# Patient Record
Sex: Female | Born: 1945
Health system: Southern US, Community
[De-identification: ages and names within clinical notes are randomized; demographics above are authoritative.]

## PROBLEM LIST (undated history)

## (undated) DIAGNOSIS — M5430 Sciatica, unspecified side: Secondary | ICD-10-CM

## (undated) DIAGNOSIS — I10 Essential (primary) hypertension: Secondary | ICD-10-CM

## (undated) DIAGNOSIS — K589 Irritable bowel syndrome without diarrhea: Secondary | ICD-10-CM

## (undated) DIAGNOSIS — K219 Gastro-esophageal reflux disease without esophagitis: Secondary | ICD-10-CM

## (undated) DIAGNOSIS — E7800 Pure hypercholesterolemia, unspecified: Secondary | ICD-10-CM

## (undated) DIAGNOSIS — K5090 Crohn's disease, unspecified, without complications: Secondary | ICD-10-CM

## (undated) DIAGNOSIS — K56699 Other intestinal obstruction unspecified as to partial versus complete obstruction: Secondary | ICD-10-CM

## (undated) DIAGNOSIS — E079 Disorder of thyroid, unspecified: Secondary | ICD-10-CM

## (undated) DIAGNOSIS — E119 Type 2 diabetes mellitus without complications: Secondary | ICD-10-CM

## (undated) DIAGNOSIS — D649 Anemia, unspecified: Secondary | ICD-10-CM

## (undated) DIAGNOSIS — K922 Gastrointestinal hemorrhage, unspecified: Secondary | ICD-10-CM

## (undated) HISTORY — PX: CHOLECYSTECTOMY: SHX55

## (undated) HISTORY — DX: Sciatica, unspecified side: M54.30

## (undated) HISTORY — PX: ESOPHAGOGASTRODUODENOSCOPY: SHX1529

## (undated) HISTORY — DX: Irritable bowel syndrome, unspecified: K58.9

## (undated) HISTORY — DX: Crohn's disease, unspecified, without complications: K50.90

## (undated) HISTORY — DX: Other intestinal obstruction unspecified as to partial versus complete obstruction: K56.699

## (undated) HISTORY — PX: COLONOSCOPY: SHX174

---

## 2013-12-18 ENCOUNTER — Encounter (HOSPITAL_BASED_OUTPATIENT_CLINIC_OR_DEPARTMENT_OTHER): Payer: Self-pay | Admitting: Emergency Medicine

## 2013-12-18 ENCOUNTER — Emergency Department (HOSPITAL_BASED_OUTPATIENT_CLINIC_OR_DEPARTMENT_OTHER): Payer: Medicare Other

## 2013-12-18 ENCOUNTER — Emergency Department (HOSPITAL_BASED_OUTPATIENT_CLINIC_OR_DEPARTMENT_OTHER)
Admission: EM | Admit: 2013-12-18 | Discharge: 2013-12-18 | Disposition: A | Discharge status: Home / Self Care | Payer: Medicare Other | Source: Home / Self Care | Attending: Emergency Medicine | Admitting: Emergency Medicine

## 2013-12-18 ENCOUNTER — Emergency Department (HOSPITAL_BASED_OUTPATIENT_CLINIC_OR_DEPARTMENT_OTHER)
Admission: EM | Admit: 2013-12-18 | Discharge: 2013-12-18 | Disposition: A | Discharge status: Home / Self Care | Payer: Medicare Other | Attending: Emergency Medicine | Admitting: Emergency Medicine

## 2013-12-18 ENCOUNTER — Telehealth (HOSPITAL_BASED_OUTPATIENT_CLINIC_OR_DEPARTMENT_OTHER): Payer: Self-pay | Admitting: *Deleted

## 2013-12-18 DIAGNOSIS — Z791 Long term (current) use of non-steroidal anti-inflammatories (NSAID): Secondary | ICD-10-CM | POA: Insufficient documentation

## 2013-12-18 DIAGNOSIS — E7800 Pure hypercholesterolemia, unspecified: Secondary | ICD-10-CM

## 2013-12-18 DIAGNOSIS — N390 Urinary tract infection, site not specified: Secondary | ICD-10-CM | POA: Insufficient documentation

## 2013-12-18 DIAGNOSIS — R110 Nausea: Secondary | ICD-10-CM | POA: Diagnosis not present

## 2013-12-18 DIAGNOSIS — H53149 Visual discomfort, unspecified: Secondary | ICD-10-CM | POA: Insufficient documentation

## 2013-12-18 DIAGNOSIS — R519 Headache, unspecified: Secondary | ICD-10-CM

## 2013-12-18 DIAGNOSIS — Z79899 Other long term (current) drug therapy: Secondary | ICD-10-CM | POA: Insufficient documentation

## 2013-12-18 DIAGNOSIS — E119 Type 2 diabetes mellitus without complications: Secondary | ICD-10-CM

## 2013-12-18 DIAGNOSIS — R51 Headache: Secondary | ICD-10-CM | POA: Insufficient documentation

## 2013-12-18 DIAGNOSIS — K219 Gastro-esophageal reflux disease without esophagitis: Secondary | ICD-10-CM

## 2013-12-18 DIAGNOSIS — I10 Essential (primary) hypertension: Secondary | ICD-10-CM

## 2013-12-18 DIAGNOSIS — Z792 Long term (current) use of antibiotics: Secondary | ICD-10-CM

## 2013-12-18 HISTORY — DX: Pure hypercholesterolemia, unspecified: E78.00

## 2013-12-18 HISTORY — DX: Type 2 diabetes mellitus without complications: E11.9

## 2013-12-18 HISTORY — DX: Gastro-esophageal reflux disease without esophagitis: K21.9

## 2013-12-18 HISTORY — DX: Essential (primary) hypertension: I10

## 2013-12-18 LAB — URINE MICROSCOPIC-ADD ON

## 2013-12-18 LAB — URINALYSIS, ROUTINE W REFLEX MICROSCOPIC
BILIRUBIN URINE: NEGATIVE
Glucose, UA: NEGATIVE mg/dL
Hgb urine dipstick: NEGATIVE
KETONES UR: NEGATIVE mg/dL
Nitrite: NEGATIVE
Protein, ur: 30 mg/dL — AB
SPECIFIC GRAVITY, URINE: 1.019 (ref 1.005–1.030)
UROBILINOGEN UA: 0.2 mg/dL (ref 0.0–1.0)
pH: 8.5 — ABNORMAL HIGH (ref 5.0–8.0)

## 2013-12-18 MED ORDER — KETOROLAC TROMETHAMINE 30 MG/ML IJ SOLN
30.0000 mg | Freq: Once | INTRAMUSCULAR | Status: AC
  Administered 2013-12-18: 30 mg via INTRAVENOUS
  Filled 2013-12-18: qty 1

## 2013-12-18 MED ORDER — HALOPERIDOL LACTATE 5 MG/ML IJ SOLN: 2.5000 mg | Freq: Once | INTRAMUSCULAR | Status: DC

## 2013-12-18 MED ORDER — DIPHENHYDRAMINE HCL 50 MG/ML IJ SOLN
25.0000 mg | Freq: Once | INTRAMUSCULAR | Status: AC
  Administered 2013-12-18: 25 mg via INTRAVENOUS
  Filled 2013-12-18: qty 1

## 2013-12-18 MED ORDER — METOCLOPRAMIDE HCL 5 MG/ML IJ SOLN
10.0000 mg | Freq: Once | INTRAMUSCULAR | Status: AC
  Administered 2013-12-18: 10 mg via INTRAVENOUS
  Filled 2013-12-18: qty 2

## 2013-12-18 MED ORDER — SODIUM CHLORIDE 0.9 % IV BOLUS (SEPSIS)
1000.0000 mL | Freq: Once | INTRAVENOUS | Status: AC
  Administered 2013-12-18: 1000 mL via INTRAVENOUS

## 2013-12-18 MED ORDER — CEPHALEXIN 500 MG PO CAPS: 500.0000 mg | ORAL_CAPSULE | Freq: Two times a day (BID) | ORAL | Status: DC

## 2013-12-18 MED ORDER — BUTALBITAL-APAP-CAFFEINE 50-325-40 MG PO TABS
1.0000 | ORAL_TABLET | Freq: Four times a day (QID) | ORAL | Status: DC | PRN
Start: 2013-12-18 — End: 2019-06-19

## 2013-12-18 MED ORDER — SODIUM CHLORIDE 0.9 % IV BOLUS (SEPSIS): 1000.0000 mL | Freq: Once | INTRAVENOUS | Status: DC

## 2013-12-18 NOTE — ED Notes (Signed)
Pt reports headache x 7 days. Reports some nausea no vomiting. Reports photosensitivty.

## 2013-12-18 NOTE — Discharge Instructions (Signed)
Do not hesitate to return to the Emergency Department for any new, worsening or concerning symptoms.   If you do not have a primary care doctor you can establish one at the   [facility]: [place] [id number] [phone]  After you establish care. Let them know you were seen in the emergency room. They must obtain records for further management.    General Headache Without Cause A general headache is pain or discomfort felt around the head or neck area. The cause may not be found.  HOME CARE   Keep all doctor visits.  Only take medicines as told by your doctor.  Lie down in a dark, quiet room when you have a headache.  Keep a journal to find out if certain things bring on headaches. For example, write down:  What you eat and drink.  How much sleep you get.  Any change to your diet or medicines.  Relax by getting a massage or doing other relaxing activities.  Put ice or heat packs on the head and neck area as told by your doctor.  Lessen stress.  Sit up straight. Do not tighten (tense) your muscles.  Quit smoking if you smoke.  Lessen how much alcohol you drink.  Lessen how much caffeine you drink, or stop drinking caffeine.  Eat and sleep on a regular schedule.  Get 7 to 9 hours of sleep, or as told by your doctor.  Keep lights dim if bright lights bother you or make your headaches worse. GET HELP RIGHT AWAY IF:   Your headache becomes really bad.  You have a fever.  You have a stiff neck.  You have trouble seeing.  Your muscles are weak, or you lose muscle control.  You lose your balance or have trouble walking.  You feel like you will pass out (faint), or you pass out.  You have really bad symptoms that are different than your first symptoms.  You have problems with the medicines given to you by your doctor.  Your medicines do not work.  Your headache feels different than the other headaches.  You feel sick to  your stomach (nauseous) or throw up (vomit). MAKE SURE YOU:   Understand these instructions.  Will watch your condition.  Will get help right away if you are not doing well or get worse. Document Released: 02/21/2008 Document Revised: 08/06/2011 Document Reviewed: 05/04/2011 [facility] Patient Information 2015 [place], [place]. This information is not intended to replace advice given to you by your health care provider. Make sure you discuss any questions you have with your health care provider.

## 2013-12-18 NOTE — ED Notes (Signed)
MD at bedside. 

## 2013-12-18 NOTE — ED Provider Notes (Signed)
Medical screening examination/treatment/procedure(s) were performed by non-physician practitioner and as supervising physician I was immediately available for consultation/collaboration.   EKG Interpretation None        Malvin Johns, MD 12/18/13 2321

## 2013-12-18 NOTE — ED Provider Notes (Signed)
CSN: [id number]     Arrival date & time 12/18/13  1943 History   First MD Initiated Contact with Patient 12/18/13 2008     Chief Complaint  Patient presents with  . Headache     (Consider location/radiation/quality/duration/timing/severity/associated sxs/prior Treatment) HPI  Carrie Watkins is a 68 y.o. female complaining of bilateral frontal headache rated 8/10 intermittently over the course of several weeks. Patient states that she has had headaches when she was younger but hasn't had them recently. She is photophobic and states that she felt dizzy earlier in the day (denies vertigo). Patient states that she had a fast heart rate she measured it at 97 she took Ativan at home states that this has resolved the fast heart rate she also endorses a shortness of breath and nasal congestion. Patient reports subjective fever, denies cervicalgia. Patient was seen earlier in the day had a normal head CT and felt improved after headache cocktail. States that the dizziness has resolved but that she is still in pain. Pt denies rash, confusion, cervicalgia, LOC/syncope, change in vision, N/V, numbness, weakness, dysarthria, ataxia, thunderclap onset, exacerbation with exertion or valsalva, exacerbation in morning, CP, abdominal pain.   Past Medical History  Diagnosis Date  . Hypertension   . Diabetes mellitus without complication   . GERD (gastroesophageal reflux disease)   . High cholesterol    History reviewed. No pertinent past surgical history. No family history on file. History  Substance Use Topics  . Smoking status: Never Smoker   . Smokeless tobacco: Not on file  . Alcohol Use: No   OB History   Grav Para Term Preterm Abortions TAB SAB Ect Mult Living                 Review of Systems  10 systems reviewed and found to be negative, except as noted in the HPI.   Allergies  Review of patient's allergies indicates no known allergies.  Home Medications   Prior to Admission  medications   Medication Sig Start Date End Date Taking? Authorizing Provider  butalbital-acetaminophen-caffeine (FIORICET) 50-325-40 MG per tablet Take 1 tablet by mouth every 6 (six) hours as needed for headache. 12/18/13   Elmyra Ricks Dianara Smullen, PA-C  celecoxib (CELEBREX) 200 MG capsule Take 200 mg by mouth 2 (two) times daily.    Historical Provider, MD  cephALEXin (KEFLEX) 500 MG capsule Take 1 capsule (500 mg total) by mouth 2 (two) times daily. 12/18/13   Houston Siren III, MD  LORazepam (ATIVAN) 0.5 MG tablet Take 0.5 mg by mouth every 8 (eight) hours.    Historical Provider, MD  metFORMIN (GLUCOPHAGE) 1000 MG tablet Take 1,000 mg by mouth 2 (two) times daily with a meal.    Historical Provider, MD  omeprazole (PRILOSEC) 40 MG capsule Take 40 mg by mouth daily.    Historical Provider, MD  simvastatin (ZOCOR) 10 MG tablet Take 10 mg by mouth daily.    Historical Provider, MD  valsartan-hydrochlorothiazide (DIOVAN-HCT) 160-12.5 MG per tablet Take 1 tablet by mouth daily.    Historical Provider, MD   BP 128/58  Pulse 88  Temp(Src) 98.5 F (36.9 C) (Oral)  Resp 22  Ht 5' 4"  (1.626 m)  Wt 170 lb (77.111 kg)  BMI 29.17 kg/m2  SpO2 100% Physical Exam  Nursing note and vitals reviewed. Constitutional: She is oriented to person, place, and time. She appears well-developed and well-nourished. No distress.  HENT:  Head: Normocephalic and atraumatic.  Mouth/Throat: Oropharynx is clear and  moist.  Eyes: Conjunctivae and EOM are normal. Pupils are equal, round, and reactive to light.  Neck: Normal range of motion. Neck supple.  FROM to C-spine. Pt can touch chin to chest without discomfort. No TTP of midline cervical spine.   Cardiovascular: Normal rate, regular rhythm and intact distal pulses.   Pulmonary/Chest: Effort normal and breath sounds normal. No stridor. No respiratory distress. She has no wheezes. She has no rales. She exhibits no tenderness.  Abdominal: Soft. Bowel sounds are  normal. She exhibits no distension and no mass. There is no tenderness. There is no rebound and no guarding.  Musculoskeletal: Normal range of motion. She exhibits no edema and no tenderness.  Neurological: She is alert and oriented to person, place, and time.  II-Visual fields grossly intact. III/IV/VI-Extraocular movements intact.  Pupils reactive bilaterally. V/VII-Smile symmetric, equal eyebrow raise,  facial sensation intact VIII- Hearing grossly intact IX/X-Normal gag XI-bilateral shoulder shrug XII-midline tongue extension Motor: 5/5 bilaterally with normal tone and bulk Cerebellar: Normal finger-to-nose  and normal heel-to-shin test.   Romberg negative Ambulates with a coordinated gait   Psychiatric: She has a normal mood and affect.    ED Course  Procedures (including critical care time) Labs Review Labs Reviewed  CBC WITH DIFFERENTIAL  BASIC METABOLIC PANEL    Imaging Review Ct Head Wo Contrast  12/18/2013   CLINICAL DATA:  68 year old female with headache for 3 days. Occipital region headache. Initial encounter.  EXAM: CT HEAD WITHOUT CONTRAST  TECHNIQUE: Contiguous axial images were obtained from the base of the skull through the vertex without intravenous contrast.  COMPARISON:  None.  FINDINGS: Visualized orbits and scalp soft tissues are within normal limits. No osseous abnormality identified. Visualized paranasal sinuses and mastoids are clear.  Cerebral volume is within normal limits for age. Mild Calcified atherosclerosis at the skull base. No suspicious intracranial vascular hyperdensity. No midline shift, ventriculomegaly, mass effect, evidence of mass lesion, intracranial hemorrhage or evidence of cortically based acute infarction. Gray-white matter differentiation is within normal limits throughout the brain; minimal nonspecific subcortical white matter hypodensity probably within normal limits for age.  IMPRESSION: Normal for age noncontrast CT appearance of the  brain.   Electronically Signed   By: Lars Pinks M.D.   On: 12/18/2013 09:43     EKG Interpretation None      MDM   Final diagnoses:  Nonintractable headache, unspecified chronicity pattern, unspecified headache type    Filed Vitals:   12/18/13 1948  BP: 128/58  Pulse: 88  Temp: 98.5 F (36.9 C)  TempSrc: Oral  Resp: 22  Height: 5' 4"  (1.626 m)  Weight: 170 lb (77.111 kg)  SpO2: 100%    Medications  sodium chloride 0.9 % bolus 1,000 mL (not administered)  haloperidol lactate (HALDOL) injection 2.5 mg (not administered)    Carrie Watkins is a 68 y.o. female presenting with persistent frontal headache. Patient was seen for similar several hours ago. States that the dizziness has resolved but the headache persists. Neuro exam is nonfocal. Patient had negative CT scan several hours ago. Patient also reports palpitations. I would like to get an EKG, basic blood work and Haldol for headache relief.  Patient reports that her headache has resolved and would like to leave the emergency room. I have recommended proceeding forward with the EKG and blood work however patient declined. I've advised her that she could change her mind return to the emergency room at any time. I've advised her to follow closely  with her primary care physician. Patient verbalized her understanding of the risks of leaving the emergency room without for further evaluation including death or disability.  Evaluation does not show pathology that would require ongoing emergent intervention or inpatient treatment. Pt is hemodynamically stable and mentating appropriately. Discussed findings and plan with patient/guardian, who agrees with care plan. All questions answered. Return precautions discussed and outpatient follow up given.   Discharge Medication List as of 12/18/2013  9:01 PM    START taking these medications   Details  butalbital-acetaminophen-caffeine (FIORICET) 50-325-40 MG per tablet Take 1 tablet by  mouth every 6 (six) hours as needed for headache., Starting 12/18/2013, Until Discontinued, [employer], PA-C 12/18/13 2211

## 2013-12-18 NOTE — ED Provider Notes (Signed)
CSN: [id number]     Arrival date & time 12/18/13  3664 History   First MD Initiated Contact with Patient 12/18/13 [phone]     Chief Complaint  Patient presents with  . Headache     (Consider location/radiation/quality/duration/timing/severity/associated sxs/prior Treatment) Patient is a 68 y.o. female presenting with headaches.  Headache Pain location:  Generalized Quality:  Dull Radiates to:  Does not radiate Severity currently:  8/10 Severity at highest:  10/10 Onset quality:  Gradual Duration: several weeks. Timing:  Constant Progression:  Waxing and waning Chronicity:  New Relieved by:  NSAIDs and cold packs Worsened by:  Light and sound Associated symptoms: nausea and photophobia   Associated symptoms: no abdominal pain, no blurred vision, no fever, no focal weakness, no visual change and no vomiting     Past Medical History  Diagnosis Date  . Hypertension   . Diabetes mellitus without complication   . GERD (gastroesophageal reflux disease)   . High cholesterol    History reviewed. No pertinent past surgical history. No family history on file. History  Substance Use Topics  . Smoking status: Never Smoker   . Smokeless tobacco: Not on file  . Alcohol Use: No   OB History   Grav Para Term Preterm Abortions TAB SAB Ect Mult Living                 Review of Systems  Constitutional: Negative for fever.  Eyes: Positive for photophobia. Negative for blurred vision.  Gastrointestinal: Positive for nausea. Negative for vomiting and abdominal pain.  Neurological: Positive for headaches. Negative for focal weakness.  All other systems reviewed and are negative.     Allergies  Review of patient's allergies indicates no known allergies.  Home Medications   Prior to Admission medications   Medication Sig Start Date End Date Taking? Authorizing Provider  celecoxib (CELEBREX) 200 MG capsule Take 200 mg by mouth 2 (two) times daily.   Yes Historical Provider, MD   LORazepam (ATIVAN) 0.5 MG tablet Take 0.5 mg by mouth every 8 (eight) hours.   Yes Historical Provider, MD  metFORMIN (GLUCOPHAGE) 1000 MG tablet Take 1,000 mg by mouth 2 (two) times daily with a meal.   Yes Historical Provider, MD  omeprazole (PRILOSEC) 40 MG capsule Take 40 mg by mouth daily.   Yes Historical Provider, MD  simvastatin (ZOCOR) 10 MG tablet Take 10 mg by mouth daily.   Yes Historical Provider, MD  valsartan-hydrochlorothiazide (DIOVAN-HCT) 160-12.5 MG per tablet Take 1 tablet by mouth daily.   Yes Historical Provider, MD   BP 163/73  Pulse 95  Temp(Src) 98.2 F (36.8 C) (Oral)  Resp 18  Ht 5' 4"  (1.626 m)  Wt 170 lb (77.111 kg)  BMI 29.17 kg/m2  SpO2 99% Physical Exam  Nursing note and vitals reviewed. Constitutional: She is oriented to person, place, and time. She appears well-developed and well-nourished. No distress.  HENT:  Head: Normocephalic and atraumatic.  Mouth/Throat: Oropharynx is clear and moist.  Eyes: Conjunctivae are normal. Pupils are equal, round, and reactive to light. No scleral icterus.  Neck: Neck supple.  Cardiovascular: Normal rate, regular rhythm, normal heart sounds and intact distal pulses.   No murmur heard. Pulmonary/Chest: Effort normal and breath sounds normal. No stridor. No respiratory distress. She has no rales.  Abdominal: Soft. Bowel sounds are normal. She exhibits no distension. There is no tenderness.  Musculoskeletal: Normal range of motion.  Neurological: She is alert and oriented to person, place, and time.  She has normal strength. No cranial nerve deficit or sensory deficit. Coordination and gait normal. GCS eye subscore is 4. GCS verbal subscore is 5. GCS motor subscore is 6.  Skin: Skin is warm and dry. No rash noted.  Psychiatric: She has a normal mood and affect. Her behavior is normal.    ED Course  Procedures (including critical care time) Labs Review Labs Reviewed  URINALYSIS, ROUTINE W REFLEX MICROSCOPIC -  Abnormal; Notable for the following:    pH 8.5 (*)    Protein, ur 30 (*)    Leukocytes, UA MODERATE (*)    All other components within normal limits  URINE MICROSCOPIC-ADD ON - Abnormal; Notable for the following:    Bacteria, UA MANY (*)    All other components within normal limits  URINE CULTURE    Imaging Review Ct Head Wo Contrast  12/18/2013   CLINICAL DATA:  68 year old female with headache for 3 days. Occipital region headache. Initial encounter.  EXAM: CT HEAD WITHOUT CONTRAST  TECHNIQUE: Contiguous axial images were obtained from the base of the skull through the vertex without intravenous contrast.  COMPARISON:  None.  FINDINGS: Visualized orbits and scalp soft tissues are within normal limits. No osseous abnormality identified. Visualized paranasal sinuses and mastoids are clear.  Cerebral volume is within normal limits for age. Mild Calcified atherosclerosis at the skull base. No suspicious intracranial vascular hyperdensity. No midline shift, ventriculomegaly, mass effect, evidence of mass lesion, intracranial hemorrhage or evidence of cortically based acute infarction. Gray-white matter differentiation is within normal limits throughout the brain; minimal nonspecific subcortical white matter hypodensity probably within normal limits for age.  IMPRESSION: Normal for age noncontrast CT appearance of the brain.   Electronically Signed   By: Lars Pinks M.D.   On: 12/18/2013 09:43  All radiology studies independently viewed by me.      EKG Interpretation None      MDM   Final diagnoses:  Acute intractable headache, unspecified headache type  UTI (lower urinary tract infection)    68 yo female with headache for several weeks now worse over past few days.  History of headaches many years ago, but none since.  Neuro intact.  Well appearing, nontoxic.  Not consistent with meningitis.  Plan CT head due to duration and new headache.  IV metoclopramide, diphenhydramine, and fluids for  symptoms.   Pt feeling much better.  UA has many bacteria and +leuks.  Will send for culture and treat with keflex.    Houston Siren III, MD 12/18/13 1136

## 2013-12-18 NOTE — Discharge Instructions (Signed)
General Headache Without Cause A headache is pain or discomfort felt around the head or neck area. The specific cause of a headache may not be found. There are many causes and types of headaches. A few common ones are:  Tension headaches.  Migraine headaches.  Cluster headaches.  Chronic daily headaches. HOME CARE INSTRUCTIONS   Keep all follow-up appointments with your caregiver or any specialist referral.  Only take over-the-counter or prescription medicines for pain or discomfort as directed by your caregiver.  Lie down in a dark, quiet room when you have a headache.  Keep a headache journal to find out what may trigger your migraine headaches. For example, write down:  What you eat and drink.  How much sleep you get.  Any change to your diet or medicines.  Try massage or other relaxation techniques.  Put ice packs or heat on the head and neck. Use these 3 to 4 times per day for 15 to 20 minutes each time, or as needed.  Limit stress.  Sit up straight, and do not tense your muscles.  Quit smoking if you smoke.  Limit alcohol use.  Decrease the amount of caffeine you drink, or stop drinking caffeine.  Eat and sleep on a regular schedule.  Get 7 to 9 hours of sleep, or as recommended by your caregiver.  Keep lights dim if bright lights bother you and make your headaches worse. SEEK MEDICAL CARE IF:   You have problems with the medicines you were prescribed.  Your medicines are not working.  You have a change from the usual headache.  You have nausea or vomiting. SEEK IMMEDIATE MEDICAL CARE IF:   Your headache becomes severe.  You have a fever.  You have a stiff neck.  You have loss of vision.  You have muscular weakness or loss of muscle control.  You start losing your balance or have trouble walking.  You feel faint or pass out.  You have severe symptoms that are different from your first symptoms. MAKE SURE YOU:   Understand these  instructions.  Will watch your condition.  Will get help right away if you are not doing well or get worse. Document Released: 05/14/2005 Document Revised: 08/06/2011 Document Reviewed: 05/30/2011 [facility] Patient Information 2015 [place], [place]. This information is not intended to replace advice given to you by your health care provider. Make sure you discuss any questions you have with your health care provider.  Urinary Tract Infection Urinary tract infections (UTIs) can develop anywhere along your urinary tract. Your urinary tract is your body's drainage system for removing wastes and extra water. Your urinary tract includes two kidneys, two ureters, a bladder, and a urethra. Your kidneys are a pair of bean-shaped organs. Each kidney is about the size of your fist. They are located below your ribs, one on each side of your spine. CAUSES Infections are caused by microbes, which are microscopic organisms, including fungi, viruses, and bacteria. These organisms are so small that they can only be seen through a microscope. Bacteria are the microbes that most commonly cause UTIs. SYMPTOMS  Symptoms of UTIs may vary by age and gender of the patient and by the location of the infection. Symptoms in young women typically include a frequent and intense urge to urinate and a painful, burning feeling in the bladder or urethra during urination. Older women and men are more likely to be tired, shaky, and weak and have muscle aches and abdominal pain. A fever may mean the  infection is in your kidneys. Other symptoms of a kidney infection include pain in your back or sides below the ribs, nausea, and vomiting. DIAGNOSIS To diagnose a UTI, your caregiver will ask you about your symptoms. Your caregiver also will ask to provide a urine sample. The urine sample will be tested for bacteria and white blood cells. White blood cells are made by your body to help fight infection. TREATMENT  Typically, UTIs can be  treated with medication. Because most UTIs are caused by a bacterial infection, they usually can be treated with the use of antibiotics. The choice of antibiotic and length of treatment depend on your symptoms and the type of bacteria causing your infection. HOME CARE INSTRUCTIONS  If you were prescribed antibiotics, take them exactly as your caregiver instructs you. Finish the medication even if you feel better after you have only taken some of the medication.  Drink enough water and fluids to keep your urine clear or pale yellow.  Avoid caffeine, tea, and carbonated beverages. They tend to irritate your bladder.  Empty your bladder often. Avoid holding urine for long periods of time.  Empty your bladder before and after sexual intercourse.  After a bowel movement, women should cleanse from front to back. Use each tissue only once. SEEK MEDICAL CARE IF:   You have back pain.  You develop a fever.  Your symptoms do not begin to resolve within 3 days. SEEK IMMEDIATE MEDICAL CARE IF:   You have severe back pain or lower abdominal pain.  You develop chills.  You have nausea or vomiting.  You have continued burning or discomfort with urination. MAKE SURE YOU:   Understand these instructions.  Will watch your condition.  Will get help right away if you are not doing well or get worse. Document Released: 02/21/2005 Document Revised: 11/13/2011 Document Reviewed: 06/22/2011 [facility] Patient Information 2015 [place], [place]. This information is not intended to replace advice given to you by your health care provider. Make sure you discuss any questions you have with your health care provider.

## 2013-12-18 NOTE — ED Notes (Signed)
Pt called inquiring about still being dizzy and having a headache, she stated that they dizziness was going away but that her head was still hurting. She was talking her ibuprofen as prescribed by wofford and was going to take her anxiety medicine. Informed pt that we would be more than happy to see her again if she felt that she was not improving, she wanted to speak with MD, informed her that he was no longer here. Pt stated she would wait a while to see if she continued to improve, if not she stated she would come back in to be evaluated

## 2013-12-18 NOTE — ED Notes (Signed)
Pt refused IV and meds states she feels better

## 2013-12-18 NOTE — ED Notes (Signed)
Pt states seen here earlier for same, c/o headache, can't rest good; states took ativan 1mns ago

## 2013-12-18 NOTE — ED Notes (Signed)
Patient denies pain and is resting comfortably.  

## 2013-12-18 NOTE — ED Notes (Signed)
Pt reports pain was no better. MD made aware and new orders received.

## 2013-12-19 LAB — URINE CULTURE

## 2014-01-13 DIAGNOSIS — I10 Essential (primary) hypertension: Secondary | ICD-10-CM | POA: Insufficient documentation

## 2014-01-13 DIAGNOSIS — E119 Type 2 diabetes mellitus without complications: Secondary | ICD-10-CM | POA: Insufficient documentation

## 2014-01-13 DIAGNOSIS — E039 Hypothyroidism, unspecified: Secondary | ICD-10-CM | POA: Insufficient documentation

## 2014-04-01 DIAGNOSIS — E559 Vitamin D deficiency, unspecified: Secondary | ICD-10-CM | POA: Insufficient documentation

## 2015-02-04 DIAGNOSIS — E782 Mixed hyperlipidemia: Secondary | ICD-10-CM | POA: Insufficient documentation

## 2015-10-04 ENCOUNTER — Emergency Department (HOSPITAL_BASED_OUTPATIENT_CLINIC_OR_DEPARTMENT_OTHER)
Admission: EM | Admit: 2015-10-04 | Discharge: 2015-10-04 | Disposition: A | Discharge status: Home / Self Care | Payer: Medicare Other | Attending: Emergency Medicine | Admitting: Emergency Medicine

## 2015-10-04 ENCOUNTER — Encounter (HOSPITAL_BASED_OUTPATIENT_CLINIC_OR_DEPARTMENT_OTHER): Payer: Self-pay | Admitting: *Deleted

## 2015-10-04 ENCOUNTER — Emergency Department (HOSPITAL_BASED_OUTPATIENT_CLINIC_OR_DEPARTMENT_OTHER): Payer: Medicare Other

## 2015-10-04 DIAGNOSIS — Y92000 Kitchen of unspecified non-institutional (private) residence as  the place of occurrence of the external cause: Secondary | ICD-10-CM | POA: Diagnosis not present

## 2015-10-04 DIAGNOSIS — E119 Type 2 diabetes mellitus without complications: Secondary | ICD-10-CM | POA: Insufficient documentation

## 2015-10-04 DIAGNOSIS — S0990XA Unspecified injury of head, initial encounter: Secondary | ICD-10-CM | POA: Diagnosis not present

## 2015-10-04 DIAGNOSIS — M79601 Pain in right arm: Secondary | ICD-10-CM | POA: Diagnosis not present

## 2015-10-04 DIAGNOSIS — Y999 Unspecified external cause status: Secondary | ICD-10-CM | POA: Diagnosis not present

## 2015-10-04 DIAGNOSIS — Y9389 Activity, other specified: Secondary | ICD-10-CM | POA: Diagnosis not present

## 2015-10-04 DIAGNOSIS — W07XXXA Fall from chair, initial encounter: Secondary | ICD-10-CM | POA: Insufficient documentation

## 2015-10-04 DIAGNOSIS — Z7984 Long term (current) use of oral hypoglycemic drugs: Secondary | ICD-10-CM | POA: Insufficient documentation

## 2015-10-04 DIAGNOSIS — W19XXXA Unspecified fall, initial encounter: Secondary | ICD-10-CM

## 2015-10-04 DIAGNOSIS — M79604 Pain in right leg: Secondary | ICD-10-CM | POA: Insufficient documentation

## 2015-10-04 DIAGNOSIS — I10 Essential (primary) hypertension: Secondary | ICD-10-CM | POA: Insufficient documentation

## 2015-10-04 DIAGNOSIS — Z79899 Other long term (current) drug therapy: Secondary | ICD-10-CM | POA: Diagnosis not present

## 2015-10-04 HISTORY — DX: Anemia, unspecified: D64.9

## 2015-10-04 HISTORY — DX: Gastrointestinal hemorrhage, unspecified: K92.2

## 2015-10-04 HISTORY — DX: Disorder of thyroid, unspecified: E07.9

## 2015-10-04 NOTE — Discharge Instructions (Signed)
Concussion, Adult A concussion is a brain injury. It is caused by:  A hit to the head.  A quick and sudden movement (jolt) of the head or neck. A concussion is usually not life threatening. Even so, it can cause serious problems. If you had a concussion before, you may have concussion-like problems after a hit to your head. HOME CARE General Instructions  Follow your doctor's directions carefully.  Take medicines only as told by your doctor.  Only take medicines your doctor says are safe.  Do not drink alcohol until your doctor says it is okay. Alcohol and some drugs can slow down healing. They can also put you at risk for further injury.  If you are having trouble remembering things, write them down.  Try to do one thing at a time if you get distracted easily. For example, do not watch TV while making dinner.  Talk to your family members or close friends when making important decisions.  Follow up with your doctor as told.  Watch your symptoms. Tell others to do the same. Serious problems can sometimes happen after a concussion. Older adults are more likely to have these problems.  Tell your teachers, school nurse, school counselor, coach, Product/process development scientist, or work Freight forwarder about your concussion. Tell them about what you can or cannot do. They should watch to see if:  It gets even harder for you to pay attention or concentrate.  It gets even harder for you to remember things or learn new things.  You need more time than normal to finish things.  You become annoyed (irritable) more than before.  You are not able to deal with stress as well.  You have more problems than before.  Rest. Make sure you:  Get plenty of sleep at night.  Go to sleep early.  Go to bed at the same time every day. Try to wake up at the same time.  Rest during the day.  Take naps when you feel tired.  Limit activities where you have to think a lot or concentrate. These include:  Doing  homework.  Doing work related to a job.  Watching TV.  Using the computer. Returning To Your Regular Activities Return to your normal activities slowly, not all at once. You must give your body and brain enough time to heal.   Do not play sports or do other athletic activities until your doctor says it is okay.  Ask your doctor when you can drive, ride a bicycle, or work other vehicles or machines. Never do these things if you feel dizzy.  Ask your doctor about when you can return to work or school. Preventing Another Concussion It is very important to avoid another brain injury, especially before you have healed. In rare cases, another injury can lead to permanent brain damage, brain swelling, or death. The risk of this is greatest during the first 7-10 days after your injury. Avoid injuries by:   Wearing a seat belt when riding in a car.  Not drinking too much alcohol.  Avoiding activities that could lead to a second concussion (such as contact sports).  Wearing a helmet when doing activities like:  Biking.  Skiing.  Skateboarding.  Skating.  Making your home safer by:  Removing things from the floor or stairways that could make you trip.  Using grab bars in bathrooms and handrails by stairs.  Placing non-slip mats on floors and in bathtubs.  Improve lighting in dark areas. GET HELP IF:  It gets even harder for you to pay attention or concentrate.  It gets even harder for you to remember things or learn new things.  You need more time than normal to finish things.  You become annoyed (irritable) more than before.  You are not able to deal with stress as well.  You have more problems than before.  You have problems keeping your balance.  You are not able to react quickly when you should. Get help if you have any of these problems for more than 2 weeks:   Lasting (chronic) headaches.  Dizziness or trouble balancing.  Feeling sick to your stomach  (nausea).  Seeing (vision) problems.  Being affected by noises or light more than normal.  Feeling sad, low, down in the dumps, blue, gloomy, or empty (depressed).  Mood changes (mood swings).  Feeling of fear or nervousness about what may happen (anxiety).  Feeling annoyed.  Memory problems.  Problems concentrating or paying attention.  Sleep problems.  Feeling tired all the time. GET HELP RIGHT AWAY IF:   You have bad headaches or your headaches get worse.  You have weakness (even if it is in one hand, leg, or part of the face).  You have loss of feeling (numbness).  You feel off balance.  You keep throwing up (vomiting).  You feel tired.  One black center of your eye (pupil) is larger than the other.  You twitch or shake violently (convulse).  Your speech is not clear (slurred).  You are more confused, easily angered (agitated), or annoyed than before.  You have more trouble resting than before.  You are unable to recognize people or places.  You have neck pain.  It is difficult to wake you up.  You have unusual behavior changes.  You pass out (lose consciousness). MAKE SURE YOU:   Understand these instructions.  Will watch your condition.  Will get help right away if you are not doing well or get worse.   This information is not intended to replace advice given to you by your health care provider. Make sure you discuss any questions you have with your health care provider.   Document Released: 05/02/2009 Document Revised: 06/04/2014 Document Reviewed: 12/04/2012 Elsevier Interactive Patient Education [employer].

## 2015-10-04 NOTE — ED Notes (Signed)
Pt states she was cleaning top of refrigerator and fell off chair and hit back of head on refrigerator. Also c/o right arm pain and right leg pain. Ambulates without difficulty. No LOC. Onset 1 hour ago.

## 2015-10-04 NOTE — ED Provider Notes (Signed)
CSN: [id number]     Arrival date & time 10/04/15  0940 History   First MD Initiated Contact with Patient 10/04/15 [phone]     Chief Complaint  Patient presents with  . Head Injury     (Consider location/radiation/quality/duration/timing/severity/associated sxs/prior Treatment) Patient is a 70 y.o. female presenting with head injury. The history is provided by the patient.  Head Injury Associated symptoms: no headaches, no nausea, no neck pain, no numbness and no vomiting   Status post fall off a chair in the kitchen striking the back of her head on the refrigerator. No loss of consciousness. Occurred about 1.5 hours ago. Patient also is complaining of slight discomfort to the right arm. Now denies any pain to the right leg. Denies any neck pain low back pain hip pain abdominal pain or chest pain. Patient just concerned about the back of her head because she hit hard. Patient is not on blood thinners. No nausea no vomiting no dizziness.  Past Medical History  Diagnosis Date  . Hypertension   . Diabetes mellitus without complication ([place])   . GERD (gastroesophageal reflux disease)   . High cholesterol   . Thyroid disease   . Anemia   . GI bleed    No past surgical history on file. No family history on file. Social History  Substance Use Topics  . Smoking status: Never Smoker   . Smokeless tobacco: None  . Alcohol Use: No   OB History    No data available     Review of Systems  Constitutional: Negative for fever.  HENT: Negative for congestion.   Eyes: Negative for visual disturbance.  Respiratory: Negative for shortness of breath.   Cardiovascular: Negative for chest pain.  Gastrointestinal: Negative for nausea, vomiting and abdominal pain.  Musculoskeletal: Negative for back pain and neck pain.  Neurological: Negative for dizziness, syncope, weakness, numbness and headaches.  Hematological: Does not bruise/bleed easily.  Psychiatric/Behavioral: Negative for confusion.       Allergies  Review of patient's allergies indicates no known allergies.  Home Medications   Prior to Admission medications   Medication Sig Start Date End Date Taking? Authorizing Provider  celecoxib (CELEBREX) 200 MG capsule Take 200 mg by mouth 2 (two) times daily.   Yes Historical Provider, MD  levothyroxine (SYNTHROID, LEVOTHROID) 125 MCG tablet Take 125 mcg by mouth daily before breakfast.   Yes Historical Provider, MD  LORazepam (ATIVAN) 0.5 MG tablet Take 0.5 mg by mouth every 8 (eight) hours.   Yes Historical Provider, MD  metFORMIN (GLUCOPHAGE) 1000 MG tablet Take 1,000 mg by mouth 2 (two) times daily with a meal.   Yes Historical Provider, MD  omeprazole (PRILOSEC) 40 MG capsule Take 40 mg by mouth daily.   Yes Historical Provider, MD  simvastatin (ZOCOR) 10 MG tablet Take 10 mg by mouth daily.   Yes Historical Provider, MD  valsartan-hydrochlorothiazide (DIOVAN-HCT) 160-12.5 MG per tablet Take 1 tablet by mouth daily.   Yes Historical Provider, MD  butalbital-acetaminophen-caffeine (FIORICET) 50-325-40 MG per tablet Take 1 tablet by mouth every 6 (six) hours as needed for headache. 12/18/13   Elmyra Ricks Pisciotta, PA-C   BP 154/76 mmHg  Pulse 94  Temp(Src) 97.7 F (36.5 C) (Oral)  Resp 18  Ht 5' 4"  (1.626 m)  Wt 77.111 kg  BMI 29.17 kg/m2  SpO2 98% Physical Exam  Constitutional: She is oriented to person, place, and time. She appears well-developed and well-nourished. No distress.  HENT:  Head: Normocephalic.  Mouth/Throat:  Oropharynx is clear and moist.  Occiput area with a 2 cm linear area of redness and swelling   Eyes: Conjunctivae and EOM are normal. Pupils are equal, round, and reactive to light.  Neck: Normal range of motion. Neck supple.  Cardiovascular: Normal rate, regular rhythm and normal heart sounds.   No murmur heard. Pulmonary/Chest: Effort normal and breath sounds normal. No respiratory distress.  Abdominal: Soft. Bowel sounds are normal.   Musculoskeletal: Normal range of motion.  Neurological: She is alert and oriented to person, place, and time. No cranial nerve deficit. She exhibits normal muscle tone. Coordination normal.  Skin: Skin is warm.  Nursing note and vitals reviewed.   ED Course  Procedures (including critical care time) Labs Review Labs Reviewed - No data to display  Imaging Review Ct Head Wo Contrast  10/04/2015  CLINICAL DATA:  Headache and dizziness after falling this morning with head injury. Initial encounter. EXAM: CT HEAD WITHOUT CONTRAST TECHNIQUE: Contiguous axial images were obtained from the base of the skull through the vertex without intravenous contrast. COMPARISON:  12/18/2013 FINDINGS: Skull and Sinuses:Negative for fracture or hemo sinus. Visualized orbits: Negative. Brain: Unremarkable. No evidence of acute infarction, hemorrhage, hydrocephalus, or mass lesion/mass effect. IMPRESSION: Negative for intracranial injury or fracture. Electronically Signed   By: Monte Fantasia M.D.   On: 10/04/2015 10:58   I have personally reviewed and evaluated these images and lab results as part of my medical decision-making.   EKG Interpretation None      MDM   Final diagnoses:  Fall, initial encounter  Head injury due to trauma, initial encounter    Patient status post fall off a chair while cleaning the refrigerator. Hit the back of her head. No loss of consciousness. At the back of her head on the refrigerator. Not no other significant injuries no neck pain the low back pain no chest pain no abdominal pain. No arm or leg pain except for a little bit of soreness to the right arm but no deformity. CT of head is negative. Occiput showed a 2 cm linear contusion with some swelling. Patient without any overt concussive symptoms at this time. Patient given concussion precautions.    Fredia Sorrow, MD 10/04/15 1118

## 2016-10-05 ENCOUNTER — Encounter (HOSPITAL_BASED_OUTPATIENT_CLINIC_OR_DEPARTMENT_OTHER): Payer: Self-pay | Admitting: *Deleted

## 2016-10-05 ENCOUNTER — Emergency Department (HOSPITAL_BASED_OUTPATIENT_CLINIC_OR_DEPARTMENT_OTHER)
Admission: EM | Admit: 2016-10-05 | Discharge: 2016-10-05 | Disposition: A | Discharge status: Home / Self Care | Payer: Medicare Other | Attending: Emergency Medicine | Admitting: Emergency Medicine

## 2016-10-05 ENCOUNTER — Emergency Department (HOSPITAL_BASED_OUTPATIENT_CLINIC_OR_DEPARTMENT_OTHER): Payer: Medicare Other

## 2016-10-05 DIAGNOSIS — I10 Essential (primary) hypertension: Secondary | ICD-10-CM | POA: Diagnosis not present

## 2016-10-05 DIAGNOSIS — N3010 Interstitial cystitis (chronic) without hematuria: Secondary | ICD-10-CM

## 2016-10-05 DIAGNOSIS — R300 Dysuria: Secondary | ICD-10-CM | POA: Diagnosis present

## 2016-10-05 DIAGNOSIS — Z79899 Other long term (current) drug therapy: Secondary | ICD-10-CM | POA: Diagnosis not present

## 2016-10-05 DIAGNOSIS — Z7984 Long term (current) use of oral hypoglycemic drugs: Secondary | ICD-10-CM | POA: Diagnosis not present

## 2016-10-05 DIAGNOSIS — E119 Type 2 diabetes mellitus without complications: Secondary | ICD-10-CM | POA: Diagnosis not present

## 2016-10-05 LAB — BASIC METABOLIC PANEL
Anion gap: 11 (ref 5–15)
BUN: 13 mg/dL (ref 6–20)
CALCIUM: 9.1 mg/dL (ref 8.9–10.3)
CO2: 24 mmol/L (ref 22–32)
Chloride: 102 mmol/L (ref 101–111)
Creatinine, Ser: 0.64 mg/dL (ref 0.44–1.00)
GFR calc Af Amer: >60 mL/min (ref >60)
GFR calc non Af Amer: >60 mL/min (ref >60)
Glucose, Bld: 97 mg/dL (ref 65–99)
POTASSIUM: 3.3 mmol/L — AB (ref 3.5–5.1)
Sodium: 137 mmol/L (ref 135–145)

## 2016-10-05 LAB — URINALYSIS, ROUTINE W REFLEX MICROSCOPIC
Bilirubin Urine: NEGATIVE
GLUCOSE, UA: NEGATIVE mg/dL
HGB URINE DIPSTICK: NEGATIVE
KETONES UR: NEGATIVE mg/dL
Leukocytes, UA: NEGATIVE
Nitrite: NEGATIVE
PROTEIN: NEGATIVE mg/dL
Specific Gravity, Urine: 1.018 (ref 1.005–1.030)
pH: 6.5 (ref 5.0–8.0)

## 2016-10-05 LAB — CBC WITH DIFFERENTIAL/PLATELET
Basophils Absolute: 0 10*3/uL (ref 0.0–0.1)
Basophils Relative: 0 %
EOS PCT: 1 %
Eosinophils Absolute: 0.1 10*3/uL (ref 0.0–0.7)
HCT: 38.7 % (ref 36.0–46.0)
Hemoglobin: 13.6 g/dL (ref 12.0–15.0)
LYMPHS PCT: 23 %
Lymphs Abs: 2.1 10*3/uL (ref 0.7–4.0)
MCH: 31.9 pg (ref 26.0–34.0)
MCHC: 35.1 g/dL (ref 30.0–36.0)
MCV: 90.8 fL (ref 78.0–100.0)
MONOS PCT: 9 %
Monocytes Absolute: 0.8 10*3/uL (ref 0.1–1.0)
Neutro Abs: 6 10*3/uL (ref 1.7–7.7)
Neutrophils Relative %: 67 %
PLATELETS: 232 10*3/uL (ref 150–400)
RBC: 4.26 MIL/uL (ref 3.87–5.11)
RDW: 11.7 % (ref 11.5–15.5)
WBC: 8.9 10*3/uL (ref 4.0–10.5)

## 2016-10-05 MED ORDER — MORPHINE SULFATE (PF) 4 MG/ML IV SOLN
4.0000 mg | Freq: Once | INTRAVENOUS | Status: AC
  Administered 2016-10-05: 4 mg via INTRAVENOUS
  Filled 2016-10-05: qty 1

## 2016-10-05 MED ORDER — AMITRIPTYLINE HCL 10 MG PO TABS: 10.0000 mg | ORAL_TABLET | Freq: Every day | ORAL | 0 refills | Status: DC

## 2016-10-05 MED ORDER — ONDANSETRON HCL 4 MG/2ML IJ SOLN
4.0000 mg | Freq: Once | INTRAMUSCULAR | Status: AC
  Administered 2016-10-05: 4 mg via INTRAVENOUS
  Filled 2016-10-05: qty 2

## 2016-10-05 MED ORDER — SODIUM CHLORIDE 0.9 % IV BOLUS (SEPSIS)
1000.0000 mL | Freq: Once | INTRAVENOUS | Status: AC
  Administered 2016-10-05: 1000 mL via INTRAVENOUS

## 2016-10-05 MED FILL — AMITRIPTYLINE HCL 10 MG TAB: 10 | 10 days supply | Qty: 30 | Fill #0

## 2016-10-05 NOTE — ED Provider Notes (Signed)
[facility] Provider Note   CSN: [id number] Arrival date & time: 10/05/16  1246     History   Chief Complaint Chief Complaint  Patient presents with  . Dysuria    HPI Carrie Watkins is a 71 y.o. female.  Pt presents to the ED today with frequent and painful urination for days.  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  She went to pcp and was put on nitrofurantoin and cipro.   She went to her urologist on 5/9 for her sx.  She was told to discontinue cipro and continue myrbetriq and add vesicare 5 mg daily.  Pt still has sx, so she came in here today.         Past Medical History:  Diagnosis Date  . Anemia   . Diabetes mellitus without complication ([place])   . GERD (gastroesophageal reflux disease)   . GI bleed   . High cholesterol   . Hypertension   . Thyroid disease     There are no active problems to display for this patient.   History reviewed. No pertinent surgical history.  OB History    No data available       Home Medications    Prior to Admission medications   Medication Sig Start Date End Date Taking? Authorizing Provider  amitriptyline (ELAVIL) 10 MG tablet Take 1 tablet (10 mg total) by mouth at bedtime. Ok to increase to 25 mg in 1 week, then 50 mg the week after that. 10/05/16   Isla Pence, MD  butalbital-acetaminophen-caffeine (FIORICET) [phone] MG per tablet Take 1 tablet by mouth every 6 (six) hours as needed for headache. 12/18/13   Pisciotta, Elmyra Ricks, PA-C  celecoxib (CELEBREX) 200 MG capsule Take 200 mg by mouth 2 (two) times daily.    [provider]  levothyroxine (SYNTHROID, LEVOTHROID) 125 MCG tablet Take 125 mcg by mouth daily before breakfast.    [provider]  LORazepam (ATIVAN) 0.5 MG tablet Take 0.5 mg by mouth every 8 (eight) hours.    [provider]  metFORMIN (GLUCOPHAGE) 1000 MG tablet Take 1,000 mg by mouth 2 (two) times daily with a  meal.    [provider]  omeprazole (PRILOSEC) 40 MG capsule Take 40 mg by mouth daily.    [provider]  simvastatin (ZOCOR) 10 MG tablet Take 10 mg by mouth daily.    [provider]  valsartan-hydrochlorothiazide (DIOVAN-HCT) 160-12.5 MG per tablet Take 1 tablet by mouth daily.    [provider]    Family History History reviewed. No pertinent family history.  Social History Social History  Substance Use Topics  . Smoking status: Never Smoker  . Smokeless tobacco: Not on file  . Alcohol use No     Allergies   Patient has no known allergies.   Review of Systems Review of Systems  Genitourinary: Positive for dysuria.  All other systems reviewed and are negative.    Physical Exam Updated Vital Signs BP (!) 161/73 (BP Location: Right Arm)   Pulse 89   Temp 98.4 F (36.9 C) (Oral)   Resp 16   Ht 5' 4"  (1.626 m)   Wt 170 lb (77.1 kg)   SpO2 97%   BMI 29.18 kg/m   Physical Exam  Constitutional: She is oriented to person, place, and time. She appears well-developed and well-nourished.  HENT:  Head: Normocephalic and atraumatic.  Right Ear: External ear normal.  Left Ear: External ear normal.  Nose: Nose normal.  Mouth/Throat: Oropharynx is clear and moist.  Eyes: Conjunctivae and EOM are normal. Pupils are equal, round, and reactive to light.  Neck: Normal range of motion. Neck supple.  Cardiovascular: Normal rate, regular rhythm, normal heart sounds and intact distal pulses.   Pulmonary/Chest: Effort normal and breath sounds normal.  Abdominal: Soft. Bowel sounds are normal. There is tenderness in the suprapubic area.  Musculoskeletal: Normal range of motion.  Neurological: She is alert and oriented to person, place, and time.  Skin: Skin is warm.  Psychiatric: She has a normal mood and affect. Her behavior is normal. Judgment and thought content normal.  Nursing note and vitals reviewed.    ED Treatments / Results    Labs (all labs ordered are listed, but only abnormal results are displayed) Labs Reviewed  BASIC METABOLIC PANEL - Abnormal; Notable for the following:       Result Value   Potassium 3.3 (*)    All other components within normal limits  URINALYSIS, ROUTINE W REFLEX MICROSCOPIC  CBC WITH DIFFERENTIAL/PLATELET    EKG  EKG Interpretation None       Radiology Ct Renal Stone Study  Result Date: 10/05/2016 CLINICAL DATA:  Acute lower abdominal pain. EXAM: CT ABDOMEN AND PELVIS WITHOUT CONTRAST TECHNIQUE: Multidetector CT imaging of the abdomen and pelvis was performed following the standard protocol without IV contrast. COMPARISON:  None. FINDINGS: Lower chest: No acute abnormality. Hepatobiliary: Multiple hepatic cysts are noted. No gallstones are noted. Pancreas: Unremarkable. No pancreatic ductal dilatation or surrounding inflammatory changes. Spleen: Normal in size without focal abnormality. Adrenals/Urinary Tract: Adrenal glands are unremarkable. Kidneys are normal, without renal calculi, focal lesion, or hydronephrosis. Bladder is unremarkable. Stomach/Bowel: Stomach is within normal limits. Appendix appears normal. No evidence of bowel wall thickening, distention, or inflammatory changes. Sigmoid diverticulosis is noted. Vascular/Lymphatic: Aortic atherosclerosis. No enlarged abdominal or pelvic lymph nodes. Reproductive: Uterus and bilateral adnexa are unremarkable. Other: No abdominal wall hernia or abnormality. No abdominopelvic ascites. Musculoskeletal: No acute or significant osseous findings. IMPRESSION: Sigmoid diverticulosis without inflammation. Aortic atherosclerosis. No acute abnormality seen in the abdomen or pelvis. Electronically Signed   By: Marijo Conception, M.D.   On: 10/05/2016 14:29    Procedures Procedures (including critical care time)  Medications Ordered in ED Medications  sodium chloride 0.9 % bolus 1,000 mL (1,000 mLs Intravenous New Bag/Given 10/05/16 1437)   morphine 4 MG/ML injection 4 mg (4 mg Intravenous Given 10/05/16 1439)  ondansetron (ZOFRAN) injection 4 mg (4 mg Intravenous Given 10/05/16 1438)     Initial Impression / Assessment and Plan / ED Course  I have reviewed the triage vital signs and the nursing notes.  Pertinent labs & imaging results that were available during my care of the patient were reviewed by me and considered in my medical decision making (see chart for details).    Up to date recommends amitriptyline for painful bladder syndrome, so I will try that.  Pt is not on a MAOI or cisapride.  Pt told to f/u with urology.  Final Clinical Impressions(s) / ED Diagnoses   Final diagnoses:  Interstitial cystitis    New Prescriptions New Prescriptions   AMITRIPTYLINE (ELAVIL) 10 MG TABLET    Take 1 tablet (10 mg total) by mouth at bedtime. Ok to increase to 25 mg in 1 week, then 50 mg the week after that.     Isla Pence, MD 10/05/16 1539

## 2016-10-05 NOTE — ED Triage Notes (Signed)
Pt c/o freq painfull urination x 1 week

## 2017-03-03 ENCOUNTER — Emergency Department (HOSPITAL_BASED_OUTPATIENT_CLINIC_OR_DEPARTMENT_OTHER)
Admission: EM | Admit: 2017-03-03 | Discharge: 2017-03-03 | Disposition: A | Discharge status: Home / Self Care | Payer: Medicare Other | Attending: Emergency Medicine | Admitting: Emergency Medicine

## 2017-03-03 ENCOUNTER — Encounter (HOSPITAL_BASED_OUTPATIENT_CLINIC_OR_DEPARTMENT_OTHER): Payer: Self-pay | Admitting: Emergency Medicine

## 2017-03-03 DIAGNOSIS — E119 Type 2 diabetes mellitus without complications: Secondary | ICD-10-CM | POA: Insufficient documentation

## 2017-03-03 DIAGNOSIS — E079 Disorder of thyroid, unspecified: Secondary | ICD-10-CM | POA: Diagnosis not present

## 2017-03-03 DIAGNOSIS — I10 Essential (primary) hypertension: Secondary | ICD-10-CM | POA: Insufficient documentation

## 2017-03-03 DIAGNOSIS — Z79899 Other long term (current) drug therapy: Secondary | ICD-10-CM | POA: Diagnosis not present

## 2017-03-03 DIAGNOSIS — R350 Frequency of micturition: Secondary | ICD-10-CM | POA: Diagnosis present

## 2017-03-03 DIAGNOSIS — N3289 Other specified disorders of bladder: Secondary | ICD-10-CM | POA: Insufficient documentation

## 2017-03-03 DIAGNOSIS — Z7984 Long term (current) use of oral hypoglycemic drugs: Secondary | ICD-10-CM | POA: Insufficient documentation

## 2017-03-03 LAB — CBC WITH DIFFERENTIAL/PLATELET
BASOS PCT: 0 %
Basophils Absolute: 0 10*3/uL (ref 0.0–0.1)
EOS ABS: 0.2 10*3/uL (ref 0.0–0.7)
EOS PCT: 2 %
HEMATOCRIT: 37.6 % (ref 36.0–46.0)
Hemoglobin: 12.6 g/dL (ref 12.0–15.0)
Lymphocytes Relative: 18 %
Lymphs Abs: 1.4 10*3/uL (ref 0.7–4.0)
MCH: 31 pg (ref 26.0–34.0)
MCHC: 33.5 g/dL (ref 30.0–36.0)
MCV: 92.6 fL (ref 78.0–100.0)
MONO ABS: 0.5 10*3/uL (ref 0.1–1.0)
MONOS PCT: 7 %
NEUTROS ABS: 5.6 10*3/uL (ref 1.7–7.7)
Neutrophils Relative %: 73 %
Platelets: 213 10*3/uL (ref 150–400)
RBC: 4.06 MIL/uL (ref 3.87–5.11)
RDW: 12.2 % (ref 11.5–15.5)
WBC: 7.7 10*3/uL (ref 4.0–10.5)

## 2017-03-03 LAB — BASIC METABOLIC PANEL
Anion gap: 7 (ref 5–15)
BUN: 13 mg/dL (ref 6–20)
CALCIUM: 8.8 mg/dL — AB (ref 8.9–10.3)
CO2: 23 mmol/L (ref 22–32)
CREATININE: 0.7 mg/dL (ref 0.44–1.00)
Chloride: 107 mmol/L (ref 101–111)
GFR calc Af Amer: >60 mL/min (ref >60)
GFR calc non Af Amer: >60 mL/min (ref >60)
GLUCOSE: 129 mg/dL — AB (ref 65–99)
Potassium: 3.7 mmol/L (ref 3.5–5.1)
Sodium: 137 mmol/L (ref 135–145)

## 2017-03-03 LAB — URINALYSIS, MICROSCOPIC (REFLEX): RBC / HPF: NONE SEEN RBC/hpf (ref 0–5)

## 2017-03-03 LAB — URINALYSIS, ROUTINE W REFLEX MICROSCOPIC
Bilirubin Urine: NEGATIVE
Glucose, UA: 250 mg/dL — AB
Hgb urine dipstick: NEGATIVE
Ketones, ur: NEGATIVE mg/dL
NITRITE: NEGATIVE
PH: 6 (ref 5.0–8.0)
Protein, ur: NEGATIVE mg/dL

## 2017-03-03 MED ORDER — DICYCLOMINE HCL 10 MG PO CAPS
10.0000 mg | ORAL_CAPSULE | Freq: Once | ORAL | Status: AC
  Administered 2017-03-03: 10 mg via ORAL
  Filled 2017-03-03: qty 1

## 2017-03-03 MED ORDER — DICYCLOMINE HCL 10 MG PO CAPS: 10.0000 mg | ORAL_CAPSULE | Freq: Three times a day (TID) | ORAL | 0 refills | Status: DC | PRN

## 2017-03-03 NOTE — ED Triage Notes (Signed)
Urinary frequency x 4 days. Was seen by PCP Friday and told UA was neg.

## 2017-03-03 NOTE — ED Provider Notes (Signed)
[facility] DEPT [facility] Provider Note   CSN: [id number] Arrival date & time: 03/03/17  0910     History   Chief Complaint Chief Complaint  Patient presents with  . Urinary Frequency    HPI Carrie Watkins is a 71 y.o. female.  HPI Patient has history of interstitial cystitis. She reports her symptoms have been fairly well controlled for a number of months. Just over the past couple of days now she has had increasing frequency of urination. She reports she's having to go to the bathroom and having urgency multiple times in a night and during the day. She denies fever or chills. No nausea or vomiting. Patient reports she is taking in fluids but feels like she is getting dehydrated. Past Medical History:  Diagnosis Date  . Anemia   . Diabetes mellitus without complication ([place])   . GERD (gastroesophageal reflux disease)   . GI bleed   . High cholesterol   . Hypertension   . Thyroid disease     There are no active problems to display for this patient.   History reviewed. No pertinent surgical history.  OB History    No data available       Home Medications    Prior to Admission medications   Medication Sig Start Date End Date Taking? Authorizing Provider  amitriptyline (ELAVIL) 10 MG tablet Take 1 tablet (10 mg total) by mouth at bedtime. Ok to increase to 25 mg in 1 week, then 50 mg the week after that. 10/05/16   Isla Pence, MD  butalbital-acetaminophen-caffeine (FIORICET) [phone] MG per tablet Take 1 tablet by mouth every 6 (six) hours as needed for headache. 12/18/13   Pisciotta, Elmyra Ricks, PA-C  celecoxib (CELEBREX) 200 MG capsule Take 200 mg by mouth 2 (two) times daily.    [provider]  dicyclomine (BENTYL) 10 MG capsule Take 1 capsule (10 mg total) by mouth 3 (three) times daily as needed for spasms. 03/03/17   Charlesetta Shanks, MD  levothyroxine (SYNTHROID, LEVOTHROID) 125 MCG tablet Take 125 mcg by mouth daily before breakfast.    [provider]  LORazepam (ATIVAN) 0.5 MG tablet Take 0.5 mg by mouth every 8 (eight) hours.    [provider]  metFORMIN (GLUCOPHAGE) 1000 MG tablet Take 1,000 mg by mouth 2 (two) times daily with a meal.    [provider]  omeprazole (PRILOSEC) 40 MG capsule Take 40 mg by mouth daily.    [provider]  simvastatin (ZOCOR) 10 MG tablet Take 10 mg by mouth daily.    [provider]  valsartan-hydrochlorothiazide (DIOVAN-HCT) 160-12.5 MG per tablet Take 1 tablet by mouth daily.    [provider]    Family History No family history on file.  Social History Social History  Substance Use Topics  . Smoking status: Never Smoker  . Smokeless tobacco: Never Used  . Alcohol use No     Allergies   Prozac [fluoxetine hcl]   Review of Systems Review of Systems 10 Systems reviewed and are negative for acute change except as noted in the HPI.   Physical Exam Updated Vital Signs BP (!) 154/73 (BP Location: Right Arm)   Pulse 73   Temp 97.8 F (36.6 C) (Oral)   Resp 18   Ht 5' 4"  (1.626 m)   Wt 77.1 kg (170 lb)   SpO2 100%   BMI 29.18 kg/m   Physical Exam  Constitutional: She is oriented to person, place, and time. She  appears well-developed and well-nourished. No distress.  HENT:  Head: Normocephalic and atraumatic.  Eyes: Conjunctivae are normal.  Neck: Neck supple.  Cardiovascular: Normal rate and regular rhythm.   No murmur heard. Pulmonary/Chest: Effort normal and breath sounds normal. No respiratory distress.  Abdominal: Soft. She exhibits no distension. There is no tenderness. There is no guarding.  No CVA tenderness.  Musculoskeletal: She exhibits no edema.  Neurological: She is alert and oriented to person, place, and time. No cranial nerve deficit. She exhibits normal muscle tone. Coordination normal.  Skin: Skin is warm and dry.  Psychiatric: She has a normal mood and affect.  Nursing note and vitals  reviewed.    ED Treatments / Results  Labs (all labs ordered are listed, but only abnormal results are displayed) Labs Reviewed  URINALYSIS, ROUTINE W REFLEX MICROSCOPIC - Abnormal; Notable for the following:       Result Value   Specific Gravity, Urine <1.005 (*)    Glucose, UA 250 (*)    Leukocytes, UA TRACE (*)    All other components within normal limits  URINALYSIS, MICROSCOPIC (REFLEX) - Abnormal; Notable for the following:    Bacteria, UA RARE (*)    Squamous Epithelial / LPF 0-5 (*)    All other components within normal limits  BASIC METABOLIC PANEL - Abnormal; Notable for the following:    Glucose, Bld 129 (*)    Calcium 8.8 (*)    All other components within normal limits  URINE CULTURE  CBC WITH DIFFERENTIAL/PLATELET    EKG  EKG Interpretation None       Radiology No results found.  Procedures Procedures (including critical care time)  Medications Ordered in ED Medications  dicyclomine (BENTYL) capsule 10 mg (10 mg Oral Given 03/03/17 1100)     Initial Impression / Assessment and Plan / ED Course  I have reviewed the triage vital signs and the nursing notes.  Pertinent labs & imaging results that were available during my care of the patient were reviewed by me and considered in my medical decision making (see chart for details).     Final Clinical Impressions(s) / ED Diagnoses   Final diagnoses:  Bladder spasms  Patient reports frequency having to go to the bathroom multiple times per day and night now. She feels urgency and only goes small amounts. Urinalysis does not suggest UTI, labs are within normal limits, no sign of uncontrolled diabetes. Bladder scan done and no sign of urinary retention. Patient perceives that she is dehydrated and requests fluids however based on clinical examination and cooperating vital signs and lab work, patient does not have dehydration. Do not advise for IV fluids at this time. I have counseled the patient on  continuing oral hydration and that she does not have evidence of dehydration. The patient's symptoms are most suggestive of her underlying interstitial cystitis, at this time patient has been treated by her urologist but is having significant spasms despite being on Myrbetriq and Vesicare. Will have the patient try Bentyl for today to see if it assists in her symptom control and contact her urologist more morning for further treatment.  New Prescriptions New Prescriptions   DICYCLOMINE (BENTYL) 10 MG CAPSULE    Take 1 capsule (10 mg total) by mouth 3 (three) times daily as needed for spasms.     Charlesetta Shanks, MD 03/03/17 1112

## 2017-03-04 LAB — URINE CULTURE: Culture: NO GROWTH

## 2017-09-17 ENCOUNTER — Emergency Department (HOSPITAL_BASED_OUTPATIENT_CLINIC_OR_DEPARTMENT_OTHER)
Admission: EM | Admit: 2017-09-17 | Discharge: 2017-09-17 | Disposition: A | Discharge status: Home / Self Care | Payer: Medicare Other | Attending: Emergency Medicine | Admitting: Emergency Medicine

## 2017-09-17 ENCOUNTER — Encounter (HOSPITAL_BASED_OUTPATIENT_CLINIC_OR_DEPARTMENT_OTHER): Payer: Self-pay | Admitting: Emergency Medicine

## 2017-09-17 ENCOUNTER — Other Ambulatory Visit: Payer: Self-pay

## 2017-09-17 DIAGNOSIS — Z7984 Long term (current) use of oral hypoglycemic drugs: Secondary | ICD-10-CM | POA: Diagnosis not present

## 2017-09-17 DIAGNOSIS — I10 Essential (primary) hypertension: Secondary | ICD-10-CM | POA: Diagnosis not present

## 2017-09-17 DIAGNOSIS — Z79899 Other long term (current) drug therapy: Secondary | ICD-10-CM | POA: Insufficient documentation

## 2017-09-17 DIAGNOSIS — N3000 Acute cystitis without hematuria: Secondary | ICD-10-CM | POA: Diagnosis not present

## 2017-09-17 DIAGNOSIS — R1030 Lower abdominal pain, unspecified: Secondary | ICD-10-CM | POA: Diagnosis present

## 2017-09-17 DIAGNOSIS — E079 Disorder of thyroid, unspecified: Secondary | ICD-10-CM | POA: Insufficient documentation

## 2017-09-17 DIAGNOSIS — R300 Dysuria: Secondary | ICD-10-CM | POA: Insufficient documentation

## 2017-09-17 DIAGNOSIS — E119 Type 2 diabetes mellitus without complications: Secondary | ICD-10-CM | POA: Insufficient documentation

## 2017-09-17 DIAGNOSIS — R350 Frequency of micturition: Secondary | ICD-10-CM | POA: Insufficient documentation

## 2017-09-17 LAB — URINALYSIS, ROUTINE W REFLEX MICROSCOPIC
Bilirubin Urine: NEGATIVE
Glucose, UA: 100 mg/dL — AB
Hgb urine dipstick: NEGATIVE
Ketones, ur: NEGATIVE mg/dL
LEUKOCYTES UA: NEGATIVE
NITRITE: POSITIVE — AB
PH: 6 (ref 5.0–8.0)
Protein, ur: NEGATIVE mg/dL
Specific Gravity, Urine: 1.01 (ref 1.005–1.030)

## 2017-09-17 LAB — URINALYSIS, MICROSCOPIC (REFLEX): RBC / HPF: NONE SEEN RBC/hpf (ref 0–5)

## 2017-09-17 MED ORDER — SODIUM CHLORIDE 0.9 % IV SOLN
1.0000 g | Freq: Once | INTRAVENOUS | Status: AC
  Administered 2017-09-17: 1 g via INTRAVENOUS
  Filled 2017-09-17: qty 10

## 2017-09-17 MED ORDER — AMOXICILLIN-POT CLAVULANATE 875-125 MG PO TABS: 1.0000 | ORAL_TABLET | Freq: Two times a day (BID) | ORAL | 0 refills | Status: DC

## 2017-09-17 MED FILL — AMOX-CLAV 875-125 MG TABLET: 875-125 | 7 days supply | Qty: 14 | Fill #0

## 2017-09-17 NOTE — ED Provider Notes (Signed)
[name] EMERGENCY DEPARTMENT Provider Note   CSN: [id number] Arrival date & time: 09/17/17  0846     History   Chief Complaint Chief Complaint  Patient presents with  . Urinary Tract Infection    HPI Carrie Watkins is a 72 y.o. female.  HPI Patient presents with suprapubic pain.  Does go to the back somewhat.  Has been on a partial course of Augmentin after she was seen at [facility] then saw her primary and was started on Bactrim.  Had a partial course of that and then went back to Augmentin.  Continue dysuria and frequency.   Proteus on urine culture.  No fevers.  No nausea or vomiting.  Had CT scan done 14 days ago for similar symptoms. Past Medical History:  Diagnosis Date  . Anemia   . Diabetes mellitus without complication ([place])   . GERD (gastroesophageal reflux disease)   . GI bleed   . High cholesterol   . Hypertension   . Thyroid disease     There are no active problems to display for this patient.   History reviewed. No pertinent surgical history.   OB History   None      Home Medications    Prior to Admission medications   Medication Sig Start Date End Date Taking? Authorizing Provider  amitriptyline (ELAVIL) 10 MG tablet Take 1 tablet (10 mg total) by mouth at bedtime. Ok to increase to 25 mg in 1 week, then 50 mg the week after that. 10/05/16   Isla Pence, MD  amoxicillin-clavulanate (AUGMENTIN) 875-125 MG tablet Take 1 tablet by mouth every 12 (twelve) hours. 09/17/17   Davonna Belling, MD  butalbital-acetaminophen-caffeine (FIORICET) [phone] MG per tablet Take 1 tablet by mouth every 6 (six) hours as needed for headache. 12/18/13   Pisciotta, Elmyra Ricks, PA-C  celecoxib (CELEBREX) 200 MG capsule Take 200 mg by mouth 2 (two) times daily.    [provider]  dicyclomine (BENTYL) 10 MG capsule Take 1 capsule (10 mg total) by mouth 3 (three) times daily as needed for spasms. 03/03/17   Charlesetta Shanks, MD  levothyroxine (SYNTHROID,  LEVOTHROID) 125 MCG tablet Take 125 mcg by mouth daily before breakfast.    [provider]  LORazepam (ATIVAN) 0.5 MG tablet Take 0.5 mg by mouth every 8 (eight) hours.    [provider]  metFORMIN (GLUCOPHAGE) 1000 MG tablet Take 1,000 mg by mouth 2 (two) times daily with a meal.    [provider]  omeprazole (PRILOSEC) 40 MG capsule Take 40 mg by mouth daily.    [provider]  simvastatin (ZOCOR) 10 MG tablet Take 10 mg by mouth daily.    [provider]  valsartan-hydrochlorothiazide (DIOVAN-HCT) 160-12.5 MG per tablet Take 1 tablet by mouth daily.    [provider]    Family History History reviewed. No pertinent family history.  Social History Social History   Tobacco Use  . Smoking status: Never Smoker  . Smokeless tobacco: Never Used  Substance Use Topics  . Alcohol use: No  . Drug use: Not on file     Allergies   Prozac [fluoxetine hcl]   Review of Systems Review of Systems  Constitutional: Negative for appetite change and fever.  Respiratory: Negative for shortness of breath.   Cardiovascular: Negative for chest pain.  Gastrointestinal: Positive for abdominal pain.  Genitourinary: Positive for dysuria and frequency. Negative for flank pain.  Musculoskeletal: Negative for back pain.  Neurological: Negative for tremors.  Hematological: Does not bruise/bleed easily.  Psychiatric/Behavioral: Negative for confusion.     Physical Exam Updated Vital Signs BP (!) 165/90   Pulse 84   Temp 98 F (36.7 C) (Oral)   Resp 18   Ht 5' 4"  (1.626 m)   Wt 77.1 kg (170 lb)   SpO2 99%   BMI 29.18 kg/m   Physical Exam  Constitutional: She appears well-developed.  HENT:  Head: Atraumatic.  Eyes: Pupils are equal, round, and reactive to light.  Neck: Neck supple.  Cardiovascular: Normal rate.  Abdominal:  Suprapubic tenderness without rebound or guarding.  Genitourinary:  Genitourinary Comments: No CVA  tenderness  Neurological: She is alert.  Skin: Skin is warm. Capillary refill takes less than 2 seconds.  Psychiatric:  Patient appears somewhat anxious.  While in the ER her husband began to have neck pain.     ED Treatments / Results  Labs (all labs ordered are listed, but only abnormal results are displayed) Labs Reviewed  URINALYSIS, ROUTINE W REFLEX MICROSCOPIC - Abnormal; Notable for the following components:      Result Value   Color, Urine ORANGE (*)    Glucose, UA 100 (*)    Nitrite POSITIVE (*)    All other components within normal limits  URINALYSIS, MICROSCOPIC (REFLEX) - Abnormal; Notable for the following components:   Bacteria, UA RARE (*)    All other components within normal limits  URINE CULTURE    EKG None  Radiology No results found.  Procedures Procedures (including critical care time)  Medications Ordered in ED Medications  cefTRIAXone (ROCEPHIN) 1 g in sodium chloride 0.9 % 100 mL IVPB (0 g Intravenous Stopped 09/17/17 1047)     Initial Impression / Assessment and Plan / ED Course  I have reviewed the triage vital signs and the nursing notes.  Pertinent labs & imaging results that were available during my care of the patient were reviewed by me and considered in my medical decision making (see chart for details).     Patient with urinary symptoms.  Reviewed cultures showed Proteus.  Susceptibility to both Augmentin and Bactrim but she has had partial courses of both of these.  Given IV Rocephin here and culture sent.  Start back on a normal course of Augmentin.  CT scan results reviewed from 14 days ago.  Final Clinical Impressions(s) / ED Diagnoses   Final diagnoses:  Acute cystitis without hematuria    ED Discharge Orders        Ordered    amoxicillin-clavulanate (AUGMENTIN) 875-125 MG tablet  Every 12 hours     09/17/17 1153       Davonna Belling, MD 09/17/17 1551

## 2017-09-17 NOTE — Discharge Instructions (Addendum)
Follow-up with your urologist for continued urinary symptoms.  Urine culture has also been sent here.

## 2017-09-17 NOTE — ED Triage Notes (Signed)
Patient reports being treated for UTI.  Reports taking antibiotics without relief.  States she feels weak.  Reports lower abdominal pain.

## 2017-09-18 LAB — URINE CULTURE: Culture: NO GROWTH

## 2018-10-27 HISTORY — PX: CHOLECYSTECTOMY: SHX55

## 2019-05-15 ENCOUNTER — Other Ambulatory Visit: Payer: Self-pay

## 2019-05-15 ENCOUNTER — Emergency Department (HOSPITAL_BASED_OUTPATIENT_CLINIC_OR_DEPARTMENT_OTHER)
Admission: EM | Admit: 2019-05-15 | Discharge: 2019-05-15 | Disposition: A | Discharge status: Home / Self Care | Payer: Medicare Other | Attending: Emergency Medicine | Admitting: Emergency Medicine

## 2019-05-15 ENCOUNTER — Encounter (HOSPITAL_BASED_OUTPATIENT_CLINIC_OR_DEPARTMENT_OTHER): Payer: Self-pay | Admitting: Emergency Medicine

## 2019-05-15 DIAGNOSIS — Z7984 Long term (current) use of oral hypoglycemic drugs: Secondary | ICD-10-CM | POA: Insufficient documentation

## 2019-05-15 DIAGNOSIS — E119 Type 2 diabetes mellitus without complications: Secondary | ICD-10-CM | POA: Diagnosis not present

## 2019-05-15 DIAGNOSIS — Z79899 Other long term (current) drug therapy: Secondary | ICD-10-CM | POA: Diagnosis not present

## 2019-05-15 DIAGNOSIS — R519 Headache, unspecified: Secondary | ICD-10-CM | POA: Diagnosis not present

## 2019-05-15 DIAGNOSIS — U071 COVID-19: Secondary | ICD-10-CM | POA: Insufficient documentation

## 2019-05-15 DIAGNOSIS — I10 Essential (primary) hypertension: Secondary | ICD-10-CM | POA: Diagnosis not present

## 2019-05-15 DIAGNOSIS — R05 Cough: Secondary | ICD-10-CM | POA: Diagnosis present

## 2019-05-15 DIAGNOSIS — M7918 Myalgia, other site: Secondary | ICD-10-CM | POA: Diagnosis not present

## 2019-05-15 DIAGNOSIS — J069 Acute upper respiratory infection, unspecified: Secondary | ICD-10-CM | POA: Insufficient documentation

## 2019-05-15 DIAGNOSIS — Z20822 Contact with and (suspected) exposure to covid-19: Secondary | ICD-10-CM

## 2019-05-15 MED ORDER — ALBUTEROL SULFATE HFA 108 (90 BASE) MCG/ACT IN AERS
2.0000 | INHALATION_SPRAY | RESPIRATORY_TRACT | Status: DC | PRN
  Administered 2019-05-15: 2 via RESPIRATORY_TRACT
  Filled 2019-05-15: qty 6.7

## 2019-05-15 NOTE — Discharge Instructions (Addendum)
Use your albuterol 1-2 puffs every 4 hours as needed for coughing and wheezing.  Stay home except to get medical care. Stay home. Most people with COVID-19 have mild illness and are able to recover at home without medical care. Do not leave your home, except to get medical care. Do not visit public areas. Take care of yourself. Get rest and stay hydrated. Get medical care when needed. Call your doctor before you go to their office for care. But, if you have trouble breathing or other concerning symptoms, call 911 for immediate help. Avoid public transportation, ride-sharing, or taxis.  If you develop emergency warning signs for COVID-19 get medical attention immediately.  Emergency warning signs include*: Trouble breathing Persistent pain or pressure in the chest New confusion or not able to be woken Bluish lips or face Blood sugars >300 Intractable vomiting or diarrhea Passing out

## 2019-05-15 NOTE — ED Triage Notes (Signed)
Cough and bodyaches since yesterday. Husband is covid+

## 2019-05-15 NOTE — ED Provider Notes (Signed)
[facility] EMERGENCY DEPARTMENT Provider Note   CSN: [id number] Arrival date & time: 05/15/19  1028     History Chief Complaint  Patient presents with  . Cough    Carrie Watkins is a 73 y.o. female.  The history is provided by the patient and medical records. No language interpreter was used.  Cough Cough characteristics:  Dry Severity:  Mild Onset quality:  Sudden Duration:  24 hours Timing:  Intermittent Progression:  Unchanged Chronicity:  New Smoker: no   Context: sick contacts and upper respiratory infection   Context comment:  Known COVID 19 exposure Relieved by:  None tried Worsened by:  Deep breathing Ineffective treatments:  None tried Associated symptoms: headaches and myalgias   Associated symptoms: no chest pain, no chills, no diaphoresis, no ear fullness, no ear pain, no eye discharge, no fever, no rash, no rhinorrhea, no shortness of breath, no sinus congestion, no sore throat, no weight loss and no wheezing        Past Medical History:  Diagnosis Date  . Anemia   . Diabetes mellitus without complication ([place])   . GERD (gastroesophageal reflux disease)   . GI bleed   . High cholesterol   . Hypertension   . Thyroid disease     There are no problems to display for this patient.   History reviewed. No pertinent surgical history.   OB History   No obstetric history on file.     No family history on file.  Social History   Tobacco Use  . Smoking status: Never Smoker  . Smokeless tobacco: Never Used  Substance Use Topics  . Alcohol use: No  . Drug use: Not on file    Home Medications Prior to Admission medications   Medication Sig Start Date End Date Taking? Authorizing Provider  amitriptyline (ELAVIL) 10 MG tablet Take 1 tablet (10 mg total) by mouth at bedtime. Ok to increase to 25 mg in 1 week, then 50 mg the week after that. 10/05/16   Isla Pence, MD  amoxicillin-clavulanate (AUGMENTIN) 875-125 MG tablet Take 1  tablet by mouth every 12 (twelve) hours. 09/17/17   Davonna Belling, MD  butalbital-acetaminophen-caffeine (FIORICET) [phone] MG per tablet Take 1 tablet by mouth every 6 (six) hours as needed for headache. 12/18/13   Pisciotta, Elmyra Ricks, PA-C  celecoxib (CELEBREX) 200 MG capsule Take 200 mg by mouth 2 (two) times daily.    [provider]  dicyclomine (BENTYL) 10 MG capsule Take 1 capsule (10 mg total) by mouth 3 (three) times daily as needed for spasms. 03/03/17   Charlesetta Shanks, MD  levothyroxine (SYNTHROID, LEVOTHROID) 125 MCG tablet Take 125 mcg by mouth daily before breakfast.    [provider]  LORazepam (ATIVAN) 0.5 MG tablet Take 0.5 mg by mouth every 8 (eight) hours.    [provider]  metFORMIN (GLUCOPHAGE) 1000 MG tablet Take 1,000 mg by mouth 2 (two) times daily with a meal.    [provider]  omeprazole (PRILOSEC) 40 MG capsule Take 40 mg by mouth daily.    [provider]  simvastatin (ZOCOR) 10 MG tablet Take 10 mg by mouth daily.    [provider]  valsartan-hydrochlorothiazide (DIOVAN-HCT) 160-12.5 MG per tablet Take 1 tablet by mouth daily.    [provider]    Allergies    Prozac [fluoxetine hcl]  Review of Systems   Review of Systems  Constitutional: Negative for chills, diaphoresis, fever and weight loss.  HENT:  Negative for ear pain, rhinorrhea and sore throat.   Eyes: Negative for discharge.  Respiratory: Positive for cough. Negative for shortness of breath and wheezing.   Cardiovascular: Negative for chest pain.  Gastrointestinal: Negative.  Negative for diarrhea, nausea and vomiting.  Genitourinary: Negative.  Negative for frequency, hematuria and urgency.  Musculoskeletal: Positive for myalgias.  Skin: Negative for rash.  Neurological: Positive for headaches.    Physical Exam Updated Vital Signs BP (!) 141/56 (BP Location: Right Arm)   Pulse 95   Temp 98.7 F (37.1 C) (Oral)   Resp 18    Ht 5' 2"  (1.575 m)   Wt 74.4 kg   SpO2 99%   BMI 30.00 kg/m   Physical Exam Vitals and nursing note reviewed.  Constitutional:      General: She is not in acute distress.    Appearance: She is well-developed. She is not diaphoretic.  HENT:     Head: Normocephalic and atraumatic.  Eyes:     General: No scleral icterus.    Conjunctiva/sclera: Conjunctivae normal.  Cardiovascular:     Rate and Rhythm: Normal rate and regular rhythm.     Heart sounds: Normal heart sounds. No murmur. No friction rub. No gallop.   Pulmonary:     Effort: Pulmonary effort is normal. No respiratory distress.     Breath sounds: Normal breath sounds. No wheezing, rhonchi or rales.  Abdominal:     General: Bowel sounds are normal. There is no distension.     Palpations: Abdomen is soft. There is no mass.     Tenderness: There is no abdominal tenderness. There is no guarding.  Musculoskeletal:     Cervical back: Normal range of motion.     Right lower leg: No edema.     Left lower leg: No edema.  Skin:    General: Skin is warm and dry.  Neurological:     Mental Status: She is alert and oriented to person, place, and time.  Psychiatric:        Behavior: Behavior normal.     ED Results / Procedures / Treatments   Labs (all labs ordered are listed, but only abnormal results are displayed) Labs Reviewed - No data to display  EKG None  Radiology No results found.  Procedures Procedures (including critical care time)  Medications Ordered in ED Medications - No data to display  ED Course  I have reviewed the triage vital signs and the nursing notes.  Pertinent labs & imaging results that were available during my care of the patient were reviewed by me and considered in my medical decision making (see chart for details).    MDM Rules/Calculators/A&P                      BP (!) 141/56 (BP Location: Right Arm)   Pulse 95   Temp 98.7 F (37.1 C) (Oral)   Resp 18   Ht 5' 2"  (1.575 m)    Wt 74.4 kg   SpO2 99%   BMI 30.00 kg/m 'Patient HDS without hypoxia. D/c with albuterol for hx of asthma.  COVID 19 test pending. Home isolation guidelines given. Return precautions discussed   Carrie Watkins was evaluated in Emergency Department on 05/15/2019 for the symptoms described in the history of present illness. She was evaluated in the context of the global COVID-19 pandemic, which necessitated consideration that the patient might be at risk for infection with the SARS-CoV-2 virus that causes COVID-19.  Institutional protocols and algorithms that pertain to the evaluation of patients at risk for COVID-19 are in a state of rapid change based on information released by regulatory bodies including the CDC and federal and state organizations. These policies and algorithms were followed during the patient's care in the ED.   Final Clinical Impression(s) / ED Diagnoses Final diagnoses:  None    Rx / DC Orders ED Discharge Orders    None       Margarita Mail, PA-C 05/15/19 1600    Charlesetta Shanks, MD 05/16/19 1245

## 2019-05-15 NOTE — ED Notes (Signed)
Pt verbalized understanding of dc instructions.

## 2019-05-18 ENCOUNTER — Other Ambulatory Visit: Payer: Self-pay | Admitting: Nurse Practitioner

## 2019-05-18 ENCOUNTER — Telehealth: Payer: Self-pay | Admitting: Nurse Practitioner

## 2019-05-18 ENCOUNTER — Encounter: Payer: Self-pay | Admitting: Nurse Practitioner

## 2019-05-18 ENCOUNTER — Telehealth: Payer: Self-pay

## 2019-05-18 DIAGNOSIS — E119 Type 2 diabetes mellitus without complications: Secondary | ICD-10-CM

## 2019-05-18 DIAGNOSIS — U071 COVID-19: Secondary | ICD-10-CM

## 2019-05-18 NOTE — Telephone Encounter (Signed)
Pt. Reports she called [place] ED and they transferred her to [facility] for COVID 19 results. Given results, verbalizes understanding. Will quarantine x 10 days from beginning of symptoms. Reviewed home safety and when to seek medical attention. Health Dept. Notified.

## 2019-05-18 NOTE — Telephone Encounter (Signed)
Called to discuss with patient about Covid symptoms and the use of bamlanivimab, a monoclonal antibody infusion for those with mild to moderate Covid symptoms and at a high risk of hospitalization.  Pt is qualified for this infusion at the [facility] infusion center due to Age > 65 and chronic diseases   Message left to call back, no My Chart unable to contact via there.

## 2019-05-18 NOTE — Progress Notes (Signed)
  I connected by phone with Carrie Watkins on 05/18/2019 at 10:42 AM to discuss the potential use of an new treatment for mild to moderate COVID-19 viral infection in non-hospitalized patients.  This patient is a 73 y.o. female that meets the FDA criteria for Emergency Use Authorization of bamlanivimab or casirivimab\imdevimab.  Has a (+) direct SARS-CoV-2 viral test result  Has mild or moderate COVID-19   Is ? 73 years of age and weighs ? 40 kg  Is NOT hospitalized due to COVID-19  Is NOT requiring oxygen therapy or requiring an increase in baseline oxygen flow rate due to COVID-19  Is within 10 days of symptom onset  Has at least one of the high risk factor(s) for progression to severe COVID-19 and/or hospitalization as defined in EUA.  Specific high risk criteria : Diabetes   I have spoken and communicated the following to the patient or parent/caregiver:  1. FDA has authorized the emergency use of bamlanivimab and casirivimab\imdevimab for the treatment of mild to moderate COVID-19 in adults and pediatric patients with positive results of direct SARS-CoV-2 viral testing who are 55 years of age and older weighing at least 40 kg, and who are at high risk for progressing to severe COVID-19 and/or hospitalization.  2. The significant known and potential risks and benefits of bamlanivimab and casirivimab\imdevimab, and the extent to which such potential risks and benefits are unknown.  3. Information on available alternative treatments and the risks and benefits of those alternatives, including clinical trials.  4. Patients treated with bamlanivimab and casirivimab\imdevimab should continue to self-isolate and use infection control measures (e.g., wear mask, isolate, social distance, avoid sharing personal items, clean and disinfect "high touch" surfaces, and frequent handwashing) according to CDC guidelines.   5. The patient or parent/caregiver has the option to accept or refuse  bamlanivimab or casirivimab\imdevimab .  After reviewing this information with the patient, The patient agreed to proceed with receiving the bamlanimivab infusion and will be provided a copy of the Fact sheet prior to receiving the infusion.Fenton Foy 05/18/2019 10:42 AM

## 2019-05-19 LAB — NOVEL CORONAVIRUS, NAA (HOSP ORDER, SEND-OUT TO REF LAB; TAT 18-24 HRS): SARS-CoV-2, NAA: DETECTED — AB

## 2019-05-20 ENCOUNTER — Ambulatory Visit (HOSPITAL_COMMUNITY)
Admission: RE | Admit: 2019-05-20 | Discharge: 2019-05-20 | Disposition: A | Discharge status: Home / Self Care | Payer: Medicare Other | Source: Ambulatory Visit | Attending: Pulmonary Disease | Admitting: Pulmonary Disease

## 2019-05-20 DIAGNOSIS — E119 Type 2 diabetes mellitus without complications: Secondary | ICD-10-CM | POA: Diagnosis not present

## 2019-05-20 DIAGNOSIS — U071 COVID-19: Secondary | ICD-10-CM | POA: Insufficient documentation

## 2019-05-20 DIAGNOSIS — Z23 Encounter for immunization: Secondary | ICD-10-CM | POA: Diagnosis not present

## 2019-05-20 MED ORDER — DIPHENHYDRAMINE HCL 50 MG/ML IJ SOLN: 50.0000 mg | Freq: Once | INTRAMUSCULAR | Status: DC | PRN

## 2019-05-20 MED ORDER — EPINEPHRINE 0.3 MG/0.3ML IJ SOAJ: 0.3000 mg | Freq: Once | INTRAMUSCULAR | Status: DC | PRN

## 2019-05-20 MED ORDER — SODIUM CHLORIDE 0.9 % IV SOLN
700.0000 mg | Freq: Once | INTRAVENOUS | Status: AC
  Administered 2019-05-20: 700 mg via INTRAVENOUS
  Filled 2019-05-20: qty 20

## 2019-05-20 MED ORDER — METHYLPREDNISOLONE SODIUM SUCC 125 MG IJ SOLR: 125.0000 mg | Freq: Once | INTRAMUSCULAR | Status: DC | PRN

## 2019-05-20 MED ORDER — SODIUM CHLORIDE 0.9 % IV SOLN
INTRAVENOUS | Status: DC | PRN
  Administered 2019-05-20: 250 mL via INTRAVENOUS

## 2019-05-20 MED ORDER — FAMOTIDINE IN NACL 20-0.9 MG/50ML-% IV SOLN: 20.0000 mg | Freq: Once | INTRAVENOUS | Status: DC | PRN

## 2019-05-20 MED ORDER — ALBUTEROL SULFATE HFA 108 (90 BASE) MCG/ACT IN AERS: 2.0000 | INHALATION_SPRAY | Freq: Once | RESPIRATORY_TRACT | Status: DC | PRN

## 2019-05-20 NOTE — Progress Notes (Signed)
  Diagnosis: COVID-19  Physician: Amparo Bristol  Procedure: [facility] Infusion [facility] Med: bamlanivimab infusion - Provided patient with bamlanimivab fact sheet for patients, parents and caregivers prior to infusion.  Complications: No immediate complications noted.  Discharge: Discharged home   Carrie Watkins 05/20/2019

## 2019-06-19 ENCOUNTER — Encounter (HOSPITAL_BASED_OUTPATIENT_CLINIC_OR_DEPARTMENT_OTHER): Payer: Self-pay | Admitting: Emergency Medicine

## 2019-06-19 ENCOUNTER — Other Ambulatory Visit: Payer: Self-pay

## 2019-06-19 ENCOUNTER — Emergency Department (HOSPITAL_BASED_OUTPATIENT_CLINIC_OR_DEPARTMENT_OTHER): Payer: Medicare Other

## 2019-06-19 ENCOUNTER — Inpatient Hospital Stay (HOSPITAL_BASED_OUTPATIENT_CLINIC_OR_DEPARTMENT_OTHER)
Admission: EM | Admit: 2019-06-19 | Discharge: 2019-06-23 | DRG: 387 | Disposition: A | Discharge status: Home / Self Care | Payer: Medicare Other | Attending: Internal Medicine | Admitting: Internal Medicine

## 2019-06-19 DIAGNOSIS — Z888 Allergy status to other drugs, medicaments and biological substances status: Secondary | ICD-10-CM | POA: Diagnosis not present

## 2019-06-19 DIAGNOSIS — Z79899 Other long term (current) drug therapy: Secondary | ICD-10-CM

## 2019-06-19 DIAGNOSIS — Z7984 Long term (current) use of oral hypoglycemic drugs: Secondary | ICD-10-CM

## 2019-06-19 DIAGNOSIS — N838 Other noninflammatory disorders of ovary, fallopian tube and broad ligament: Secondary | ICD-10-CM | POA: Diagnosis present

## 2019-06-19 DIAGNOSIS — E039 Hypothyroidism, unspecified: Secondary | ICD-10-CM | POA: Diagnosis present

## 2019-06-19 DIAGNOSIS — Z9049 Acquired absence of other specified parts of digestive tract: Secondary | ICD-10-CM | POA: Diagnosis not present

## 2019-06-19 DIAGNOSIS — K56600 Partial intestinal obstruction, unspecified as to cause: Secondary | ICD-10-CM | POA: Insufficient documentation

## 2019-06-19 DIAGNOSIS — Z79891 Long term (current) use of opiate analgesic: Secondary | ICD-10-CM

## 2019-06-19 DIAGNOSIS — R112 Nausea with vomiting, unspecified: Secondary | ICD-10-CM | POA: Diagnosis present

## 2019-06-19 DIAGNOSIS — Z79890 Hormone replacement therapy (postmenopausal): Secondary | ICD-10-CM | POA: Diagnosis not present

## 2019-06-19 DIAGNOSIS — Z791 Long term (current) use of non-steroidal anti-inflammatories (NSAID): Secondary | ICD-10-CM

## 2019-06-19 DIAGNOSIS — I4581 Long QT syndrome: Secondary | ICD-10-CM | POA: Diagnosis present

## 2019-06-19 DIAGNOSIS — K219 Gastro-esophageal reflux disease without esophagitis: Secondary | ICD-10-CM | POA: Diagnosis present

## 2019-06-19 DIAGNOSIS — Z881 Allergy status to other antibiotic agents status: Secondary | ICD-10-CM

## 2019-06-19 DIAGNOSIS — I10 Essential (primary) hypertension: Secondary | ICD-10-CM | POA: Diagnosis present

## 2019-06-19 DIAGNOSIS — F419 Anxiety disorder, unspecified: Secondary | ICD-10-CM | POA: Diagnosis present

## 2019-06-19 DIAGNOSIS — E782 Mixed hyperlipidemia: Secondary | ICD-10-CM | POA: Diagnosis present

## 2019-06-19 DIAGNOSIS — Z8616 Personal history of COVID-19: Secondary | ICD-10-CM | POA: Diagnosis not present

## 2019-06-19 DIAGNOSIS — R519 Headache, unspecified: Secondary | ICD-10-CM | POA: Diagnosis not present

## 2019-06-19 DIAGNOSIS — E876 Hypokalemia: Secondary | ICD-10-CM | POA: Diagnosis not present

## 2019-06-19 DIAGNOSIS — R1033 Periumbilical pain: Secondary | ICD-10-CM | POA: Diagnosis present

## 2019-06-19 DIAGNOSIS — K50012 Crohn's disease of small intestine with intestinal obstruction: Principal | ICD-10-CM | POA: Diagnosis present

## 2019-06-19 DIAGNOSIS — E119 Type 2 diabetes mellitus without complications: Secondary | ICD-10-CM | POA: Diagnosis present

## 2019-06-19 DIAGNOSIS — R109 Unspecified abdominal pain: Secondary | ICD-10-CM | POA: Diagnosis present

## 2019-06-19 DIAGNOSIS — K56609 Unspecified intestinal obstruction, unspecified as to partial versus complete obstruction: Secondary | ICD-10-CM | POA: Diagnosis present

## 2019-06-19 DIAGNOSIS — Z23 Encounter for immunization: Secondary | ICD-10-CM

## 2019-06-19 DIAGNOSIS — F329 Major depressive disorder, single episode, unspecified: Secondary | ICD-10-CM | POA: Diagnosis present

## 2019-06-19 DIAGNOSIS — U071 COVID-19: Secondary | ICD-10-CM | POA: Diagnosis present

## 2019-06-19 DIAGNOSIS — Z8719 Personal history of other diseases of the digestive system: Secondary | ICD-10-CM

## 2019-06-19 DIAGNOSIS — R933 Abnormal findings on diagnostic imaging of other parts of digestive tract: Secondary | ICD-10-CM | POA: Diagnosis not present

## 2019-06-19 LAB — CBC WITH DIFFERENTIAL/PLATELET
Abs Immature Granulocytes: 0.03 10*3/uL (ref 0.00–0.07)
Abs Immature Granulocytes: 0.03 10*3/uL (ref 0.00–0.07)
Basophils Absolute: 0 10*3/uL (ref 0.0–0.1)
Basophils Absolute: 0 10*3/uL (ref 0.0–0.1)
Basophils Relative: 0 %
Basophils Relative: 0 %
Eosinophils Absolute: 0 10*3/uL (ref 0.0–0.5)
Eosinophils Absolute: 0 10*3/uL (ref 0.0–0.5)
Eosinophils Relative: 0 %
Eosinophils Relative: 0 %
HCT: 41.7 % (ref 36.0–46.0)
HCT: 38.6 % (ref 36.0–46.0)
Hemoglobin: 13.2 g/dL (ref 12.0–15.0)
Hemoglobin: 12.4 g/dL (ref 12.0–15.0)
Immature Granulocytes: 0 %
Immature Granulocytes: 0 %
Lymphocytes Relative: 15 %
Lymphocytes Relative: 16 %
Lymphs Abs: 1.4 10*3/uL (ref 0.7–4.0)
Lymphs Abs: 1.5 10*3/uL (ref 0.7–4.0)
MCH: 29.5 pg (ref 26.0–34.0)
MCH: 29.7 pg (ref 26.0–34.0)
MCHC: 31.7 g/dL (ref 30.0–36.0)
MCHC: 32.1 g/dL (ref 30.0–36.0)
MCV: 93.3 fL (ref 80.0–100.0)
MCV: 92.6 fL (ref 80.0–100.0)
Monocytes Absolute: 0.5 10*3/uL (ref 0.1–1.0)
Monocytes Absolute: 0.8 10*3/uL (ref 0.1–1.0)
Monocytes Relative: 5 %
Monocytes Relative: 9 %
Neutro Abs: 7.3 10*3/uL (ref 1.7–7.7)
Neutro Abs: 6.9 10*3/uL (ref 1.7–7.7)
Neutrophils Relative %: 80 %
Neutrophils Relative %: 75 %
Platelets: 223 10*3/uL (ref 150–400)
Platelets: 197 10*3/uL (ref 150–400)
RBC: 4.47 MIL/uL (ref 3.87–5.11)
RBC: 4.17 MIL/uL (ref 3.87–5.11)
RDW: 12 % (ref 11.5–15.5)
RDW: 12.5 % (ref 11.5–15.5)
WBC: 9.3 10*3/uL (ref 4.0–10.5)
WBC: 9.4 10*3/uL (ref 4.0–10.5)
nRBC: 0 % (ref 0.0–0.2)
nRBC: 0 % (ref 0.0–0.2)

## 2019-06-19 LAB — COMPREHENSIVE METABOLIC PANEL
ALT: 18 U/L (ref 0–44)
AST: 25 U/L (ref 15–41)
Albumin: 4.3 g/dL (ref 3.5–5.0)
Alkaline Phosphatase: 97 U/L (ref 38–126)
Anion gap: 10 (ref 5–15)
BUN: 14 mg/dL (ref 8–23)
CO2: 21 mmol/L — ABNORMAL LOW (ref 22–32)
Calcium: 9.2 mg/dL (ref 8.9–10.3)
Chloride: 106 mmol/L (ref 98–111)
Creatinine, Ser: 0.59 mg/dL (ref 0.44–1.00)
GFR calc Af Amer: >60 mL/min (ref >60)
GFR calc non Af Amer: >60 mL/min (ref >60)
Glucose, Bld: 169 mg/dL — ABNORMAL HIGH (ref 70–99)
Potassium: 3.8 mmol/L (ref 3.5–5.1)
Sodium: 137 mmol/L (ref 135–145)
Total Bilirubin: 1 mg/dL (ref 0.3–1.2)
Total Protein: 7.8 g/dL (ref 6.5–8.1)

## 2019-06-19 LAB — URINALYSIS, MICROSCOPIC (REFLEX)

## 2019-06-19 LAB — URINALYSIS, ROUTINE W REFLEX MICROSCOPIC
Bilirubin Urine: NEGATIVE
Glucose, UA: NEGATIVE mg/dL
Hgb urine dipstick: NEGATIVE
Ketones, ur: 15 mg/dL — AB
Nitrite: NEGATIVE
Protein, ur: 30 mg/dL — AB
Specific Gravity, Urine: >1.03 — ABNORMAL HIGH (ref 1.005–1.030)
pH: 6 (ref 5.0–8.0)

## 2019-06-19 LAB — HEMOGLOBIN A1C
Hgb A1c MFr Bld: 6.3 % — ABNORMAL HIGH (ref 4.8–5.6)
Mean Plasma Glucose: 134.11 mg/dL

## 2019-06-19 LAB — LIPASE, BLOOD: Lipase: 19 U/L (ref 11–51)

## 2019-06-19 LAB — GLUCOSE, CAPILLARY: Glucose-Capillary: 124 mg/dL — ABNORMAL HIGH (ref 70–99)

## 2019-06-19 MED ORDER — SODIUM CHLORIDE 0.9 % IV SOLN
INTRAVENOUS | Status: AC
  Filled 2019-06-19: qty 20

## 2019-06-19 MED ORDER — SODIUM CHLORIDE 0.9 % IV SOLN
INTRAVENOUS | Status: DC | PRN
  Administered 2019-06-19 – 2019-06-22 (×2): 250 mL via INTRAVENOUS

## 2019-06-19 MED ORDER — METOCLOPRAMIDE HCL 5 MG/ML IJ SOLN
5.0000 mg | Freq: Once | INTRAMUSCULAR | Status: AC
  Administered 2019-06-19: 03:00:00 5 mg via INTRAVENOUS
  Filled 2019-06-19: qty 2

## 2019-06-19 MED ORDER — MORPHINE SULFATE (PF) 4 MG/ML IV SOLN
4.0000 mg | INTRAVENOUS | Status: DC | PRN
  Administered 2019-06-19 (×3): 4 mg via INTRAVENOUS
  Filled 2019-06-19 (×3): qty 1

## 2019-06-19 MED ORDER — METRONIDAZOLE IN NACL 5-0.79 MG/ML-% IV SOLN
500.0000 mg | Freq: Three times a day (TID) | INTRAVENOUS | Status: DC
  Administered 2019-06-19 – 2019-06-22 (×8): 500 mg via INTRAVENOUS
  Filled 2019-06-19 (×8): qty 100

## 2019-06-19 MED ORDER — ACETAMINOPHEN 650 MG RE SUPP: 650.0000 mg | Freq: Four times a day (QID) | RECTAL | Status: DC | PRN

## 2019-06-19 MED ORDER — ONDANSETRON HCL 4 MG/2ML IJ SOLN
4.0000 mg | Freq: Once | INTRAMUSCULAR | Status: AC
  Administered 2019-06-19: 03:00:00 4 mg via INTRAVENOUS
  Filled 2019-06-19: qty 2

## 2019-06-19 MED ORDER — ONDANSETRON HCL 4 MG PO TABS: 4.0000 mg | ORAL_TABLET | Freq: Four times a day (QID) | ORAL | Status: DC | PRN

## 2019-06-19 MED ORDER — LORAZEPAM 2 MG/ML IJ SOLN
0.5000 mg | INTRAMUSCULAR | Status: DC | PRN
  Administered 2019-06-19 – 2019-06-21 (×7): 0.5 mg via INTRAVENOUS
  Filled 2019-06-19 (×6): qty 1

## 2019-06-19 MED ORDER — SODIUM CHLORIDE 0.9 % IV SOLN
2.0000 g | Freq: Once | INTRAVENOUS | Status: AC
  Administered 2019-06-19: 2 g via INTRAVENOUS
  Filled 2019-06-19: qty 20

## 2019-06-19 MED ORDER — PANTOPRAZOLE SODIUM 40 MG IV SOLR
40.0000 mg | INTRAVENOUS | Status: DC
  Administered 2019-06-19 – 2019-06-20 (×2): 40 mg via INTRAVENOUS
  Filled 2019-06-19 (×2): qty 40

## 2019-06-19 MED ORDER — ACETAMINOPHEN 325 MG PO TABS
650.0000 mg | ORAL_TABLET | Freq: Once | ORAL | Status: AC
  Administered 2019-06-19: 650 mg via ORAL
  Filled 2019-06-19: qty 2

## 2019-06-19 MED ORDER — METRONIDAZOLE IN NACL 5-0.79 MG/ML-% IV SOLN
500.0000 mg | Freq: Once | INTRAVENOUS | Status: AC
  Administered 2019-06-19: 06:00:00 500 mg via INTRAVENOUS
  Filled 2019-06-19: qty 100

## 2019-06-19 MED ORDER — IOHEXOL 300 MG/ML  SOLN
100.0000 mL | Freq: Once | INTRAMUSCULAR | Status: AC | PRN
  Administered 2019-06-19: 100 mL via INTRAVENOUS

## 2019-06-19 MED ORDER — SODIUM CHLORIDE 0.9 % IV BOLUS
1000.0000 mL | Freq: Once | INTRAVENOUS | Status: AC
  Administered 2019-06-19: 1000 mL via INTRAVENOUS

## 2019-06-19 MED ORDER — MORPHINE SULFATE (PF) 2 MG/ML IV SOLN
2.0000 mg | INTRAVENOUS | Status: DC | PRN
  Administered 2019-06-20 – 2019-06-22 (×7): 2 mg via INTRAVENOUS
  Filled 2019-06-19 (×7): qty 1

## 2019-06-19 MED ORDER — DEXTROSE IN LACTATED RINGERS 5 % IV SOLN: INTRAVENOUS | Status: DC

## 2019-06-19 MED ORDER — LEVOTHYROXINE SODIUM 100 MCG/5ML IV SOLN
50.0000 ug | Freq: Every day | INTRAVENOUS | Status: DC
  Administered 2019-06-19 – 2019-06-21 (×3): 50 ug via INTRAVENOUS
  Filled 2019-06-19 (×3): qty 5

## 2019-06-19 MED ORDER — ENALAPRILAT 1.25 MG/ML IV SOLN
0.6250 mg | Freq: Four times a day (QID) | INTRAVENOUS | Status: DC
  Administered 2019-06-20 – 2019-06-21 (×6): 0.625 mg via INTRAVENOUS
  Filled 2019-06-19 (×8): qty 0.5

## 2019-06-19 MED ORDER — LORAZEPAM 2 MG/ML IJ SOLN
0.5000 mg | Freq: Four times a day (QID) | INTRAMUSCULAR | Status: DC | PRN
  Filled 2019-06-19: qty 1

## 2019-06-19 MED ORDER — ONDANSETRON HCL 4 MG/2ML IJ SOLN
4.0000 mg | Freq: Four times a day (QID) | INTRAMUSCULAR | Status: DC | PRN
  Administered 2019-06-19 – 2019-06-21 (×3): 4 mg via INTRAVENOUS
  Filled 2019-06-19 (×3): qty 2

## 2019-06-19 MED ORDER — SODIUM CHLORIDE 0.9 % IV SOLN
2.0000 g | INTRAVENOUS | Status: DC
  Administered 2019-06-20 – 2019-06-22 (×3): 2 g via INTRAVENOUS
  Filled 2019-06-19 (×3): qty 2

## 2019-06-19 MED ORDER — PNEUMOCOCCAL VAC POLYVALENT 25 MCG/0.5ML IJ INJ
0.5000 mL | INJECTION | INTRAMUSCULAR | Status: AC
  Administered 2019-06-20: 11:00:00 0.5 mL via INTRAMUSCULAR
  Filled 2019-06-19: qty 0.5

## 2019-06-19 MED ORDER — ACETAMINOPHEN 325 MG PO TABS
650.0000 mg | ORAL_TABLET | Freq: Four times a day (QID) | ORAL | Status: DC | PRN
  Administered 2019-06-20 – 2019-06-22 (×2): 650 mg via ORAL
  Filled 2019-06-19 (×2): qty 2

## 2019-06-19 MED ORDER — INSULIN ASPART 100 UNIT/ML ~~LOC~~ SOLN
0.0000 [IU] | SUBCUTANEOUS | Status: DC
  Administered 2019-06-23: 1 [IU] via SUBCUTANEOUS

## 2019-06-19 MED ORDER — KETOROLAC TROMETHAMINE 15 MG/ML IJ SOLN
15.0000 mg | Freq: Four times a day (QID) | INTRAMUSCULAR | Status: DC | PRN
  Administered 2019-06-19 – 2019-06-20 (×4): 15 mg via INTRAVENOUS
  Filled 2019-06-19 (×4): qty 1

## 2019-06-19 MED ORDER — HYDROMORPHONE HCL 1 MG/ML IJ SOLN
0.5000 mg | INTRAMUSCULAR | Status: DC | PRN
  Administered 2019-06-22: 0.5 mg via INTRAVENOUS
  Filled 2019-06-19: qty 0.5

## 2019-06-19 NOTE — ED Provider Notes (Signed)
[facility] EMERGENCY DEPARTMENT Provider Note   CSN: [id number] Arrival date & time: 06/19/19  0243     History Chief Complaint  Patient presents with  . Abdominal Pain    Carrie Watkins is a 74 y.o. female.  Patient presents with abdominal pain, nausea and vomiting.  Symptoms began earlier today after eating a pimiento cheese sandwich on rye.  She feels bloated and has diffuse lower abdominal pain and cramping.  She has had persistent severe nausea and has vomited twice.  She denies diarrhea and constipation.  No fever.        Past Medical History:  Diagnosis Date  . Anemia   . Diabetes mellitus without complication ([place])   . GERD (gastroesophageal reflux disease)   . GI bleed   . High cholesterol   . Hypertension   . Thyroid disease     Patient Active Problem List   Diagnosis Date Noted  . Mixed hyperlipidemia 02/04/2015  . Vitamin D deficiency 04/01/2014  . Diabetes mellitus type 2, controlled, without complications ([place]) 24/01/7352  . Essential hypertension 01/13/2014  . Hypothyroidism 01/13/2014    History reviewed. No pertinent surgical history.   OB History   No obstetric history on file.     No family history on file.  Social History   Tobacco Use  . Smoking status: Never Smoker  . Smokeless tobacco: Never Used  Substance Use Topics  . Alcohol use: No  . Drug use: Not on file    Home Medications Prior to Admission medications   Medication Sig Start Date End Date Taking? Authorizing Provider  amitriptyline (ELAVIL) 10 MG tablet Take 1 tablet (10 mg total) by mouth at bedtime. Ok to increase to 25 mg in 1 week, then 50 mg the week after that. 10/05/16   Isla Pence, MD  amoxicillin-clavulanate (AUGMENTIN) 875-125 MG tablet Take 1 tablet by mouth every 12 (twelve) hours. 09/17/17   Davonna Belling, MD  butalbital-acetaminophen-caffeine (FIORICET) [phone] MG per tablet Take 1 tablet by mouth every 6 (six) hours as needed for  headache. 12/18/13   Pisciotta, Elmyra Ricks, PA-C  celecoxib (CELEBREX) 200 MG capsule Take 200 mg by mouth 2 (two) times daily.    [provider]  dicyclomine (BENTYL) 10 MG capsule Take 1 capsule (10 mg total) by mouth 3 (three) times daily as needed for spasms. 03/03/17   Charlesetta Shanks, MD  levothyroxine (SYNTHROID, LEVOTHROID) 125 MCG tablet Take 125 mcg by mouth daily before breakfast.    [provider]  LORazepam (ATIVAN) 0.5 MG tablet Take 0.5 mg by mouth every 8 (eight) hours.    [provider]  metFORMIN (GLUCOPHAGE) 1000 MG tablet Take 1,000 mg by mouth 2 (two) times daily with a meal.    [provider]  omeprazole (PRILOSEC) 40 MG capsule Take 40 mg by mouth daily.    [provider]  simvastatin (ZOCOR) 10 MG tablet Take 10 mg by mouth daily.    [provider]  valsartan-hydrochlorothiazide (DIOVAN-HCT) 160-12.5 MG per tablet Take 1 tablet by mouth daily.    [provider]    Allergies    Prozac [fluoxetine hcl]  Review of Systems   Review of Systems  Gastrointestinal: Positive for abdominal distention, abdominal pain, nausea and vomiting.  All other systems reviewed and are negative.   Physical Exam Updated Vital Signs BP (!) 169/72 (BP Location: Right Arm)   Pulse 74   Temp 97.8 F (36.6 C) (Oral)   Resp 19  Ht 5' 4"  (1.626 m)   Wt 72.6 kg   SpO2 100%   BMI 27.46 kg/m   Physical Exam Vitals and nursing note reviewed.  Constitutional:      General: She is not in acute distress.    Appearance: Normal appearance. She is well-developed.  HENT:     Head: Normocephalic and atraumatic.     Right Ear: Hearing normal.     Left Ear: Hearing normal.     Nose: Nose normal.  Eyes:     Conjunctiva/sclera: Conjunctivae normal.     Pupils: Pupils are equal, round, and reactive to light.  Cardiovascular:     Rate and Rhythm: Regular rhythm.     Heart sounds: S1 normal and S2 normal. No murmur. No friction  rub. No gallop.   Pulmonary:     Effort: Pulmonary effort is normal. No respiratory distress.     Breath sounds: Normal breath sounds.  Chest:     Chest wall: No tenderness.  Abdominal:     General: Bowel sounds are decreased.     Palpations: Abdomen is soft.     Tenderness: There is generalized abdominal tenderness. There is no guarding or rebound. Negative signs include Murphy's sign and McBurney's sign.     Hernia: No hernia is present.  Musculoskeletal:        General: Normal range of motion.     Cervical back: Normal range of motion and neck supple.  Skin:    General: Skin is warm and dry.     Findings: No rash.  Neurological:     Mental Status: She is alert and oriented to person, place, and time.     GCS: GCS eye subscore is 4. GCS verbal subscore is 5. GCS motor subscore is 6.     Cranial Nerves: No cranial nerve deficit.     Sensory: No sensory deficit.     Coordination: Coordination normal.  Psychiatric:        Speech: Speech normal.        Behavior: Behavior normal.        Thought Content: Thought content normal.     ED Results / Procedures / Treatments   Labs (all labs ordered are listed, but only abnormal results are displayed) Labs Reviewed  COMPREHENSIVE METABOLIC PANEL - Abnormal; Notable for the following components:      Result Value   CO2 21 (*)    Glucose, Bld 169 (*)    All other components within normal limits  URINALYSIS, ROUTINE W REFLEX MICROSCOPIC - Abnormal; Notable for the following components:   Specific Gravity, Urine >1.030 (*)    Ketones, ur 15 (*)    Protein, ur 30 (*)    Leukocytes,Ua SMALL (*)    All other components within normal limits  URINALYSIS, MICROSCOPIC (REFLEX) - Abnormal; Notable for the following components:   Bacteria, UA MANY (*)    All other components within normal limits  URINE CULTURE  CBC WITH DIFFERENTIAL/PLATELET  LIPASE, BLOOD    EKG EKG Interpretation  Date/Time:  Friday June 19 2019 03:09:09  EST Ventricular Rate:  69 PR Interval:    QRS Duration: 106 QT Interval:  453 QTC Calculation: 486 R Axis:   51 Text Interpretation: Sinus rhythm Borderline repolarization abnormality Borderline prolonged QT interval No previous tracing Confirmed by Orpah Greek [phone]) on 06/19/2019 3:10:50 AM   Radiology CT ABDOMEN PELVIS W CONTRAST  Result Date: 06/19/2019 CLINICAL DATA:  Abdominal pain. Nausea and vomiting. EXAM: CT  ABDOMEN AND PELVIS WITH CONTRAST TECHNIQUE: Multidetector CT imaging of the abdomen and pelvis was performed using the standard protocol following bolus administration of intravenous contrast. CONTRAST:  171m OMNIPAQUE IOHEXOL 300 MG/ML  SOLN COMPARISON:  CT of the abdomen and pelvis without contrast 10/05/2016. CT abdomen pelvis without and with contrast 08/24/2015 FINDINGS: Lower chest: Linear atelectasis or scarring is present in the left lower lobe. The heart size is normal. No significant pleural or pericardial effusion is present. Hepatobiliary: Multiple bilateral static cysts are again seen. Cholecystectomy is noted. The common bile duct is within normal limits. Pancreas: Unremarkable. No pancreatic ductal dilatation or surrounding inflammatory changes. Spleen: Normal in size without focal abnormality. Adrenals/Urinary Tract: A right adrenal adenoma is stable. The left adrenal gland is normal. Kidneys and ureters are within limits. The urinary bladder is mostly collapsed. Stomach/Bowel: Stomach and duodenum are within limits. Distal loops of small bowel progressively dilated to 3.7 cm. There is marked wall thickening in the terminal ileum. No mass lesion is present. The appendix is within normal limits. The ascending colon is within normal limits. Diverticula are present throughout the colon beginning in the transverse colon and most prominent in the sigmoid colon. No focal inflammatory changes present about the colon. Vascular/Lymphatic: Atherosclerotic calcifications  are present the aorta without aneurysm. No significant adenopathy is present. Reproductive: A progressively enlarging left adnexal cystic lesion now measures 4.2 x 3.6 x 2.6 cm. Uterus and right adnexa are within normal limits. Other: Free fluid is present within the anatomic pelvis. No free air is present. No significant ventral hernia is present. Musculoskeletal: Degenerative facet changes are present in the lower lumbar spine. No focal lytic or blastic lesions are present. Bony pelvis is within normal limits. The hips are located and within normal limits. IMPRESSION: 1. Distal small bowel obstruction secondary inflammatory changes in the terminal ileum raising concern for inflammatory bowel disease. Focal infection is also considered. 2. Free fluid within the anatomic pelvis is likely reactive. 3. Progressive enlargement of left adnexal cystic lesion now measuring 4.2 x 3.6 x 2.6 cm. Given the interval growth of this lesion and the patient's age, follow-up gyn referral and ultrasound is recommended. 4. Stable hepatic cysts. 5. Stable right adrenal adenoma. Electronically Signed   By: CSan MorelleM.D.   On: 06/19/2019 04:34    Procedures Procedures (including critical care time)  Medications Ordered in ED Medications  cefTRIAXone (ROCEPHIN) 2 g in sodium chloride 0.9 % 100 mL IVPB (has no administration in time range)    And  metroNIDAZOLE (FLAGYL) IVPB 500 mg (has no administration in time range)  sodium chloride 0.9 % bolus 1,000 mL (1,000 mLs Intravenous New Bag/Given 06/19/19 0303)  ondansetron (ZOFRAN) injection 4 mg (4 mg Intravenous Given 06/19/19 0303)  metoCLOPramide (REGLAN) injection 5 mg (5 mg Intravenous Given 06/19/19 0303)  iohexol (OMNIPAQUE) 300 MG/ML solution 100 mL (100 mLs Intravenous Contrast Given 06/19/19 0359)  acetaminophen (TYLENOL) tablet 650 mg (650 mg Oral Given 06/19/19 0415)    ED Course  I have reviewed the triage vital signs and the nursing  notes.  Pertinent labs & imaging results that were available during my care of the patient were reviewed by me and considered in my medical decision making (see chart for details).    MDM Rules/Calculators/A&P                      Patient presents to the emergency department for evaluation of abdominal pain with nausea and  vomiting.  Patient with abdominal distention and diffuse tenderness without peritonitis.  No leukocytosis, labs otherwise unremarkable.  Patient underwent CT scan to further evaluate.  Results concerning for small bowel obstruction.  There are findings of inflammatory changes at the transition point that are concerning for inflammatory bowel disease or possible focal infection.  Patient will therefore require hospitalization for further management.  She will be given empiric antibiotics to cover for the possible infection.  NG tube to be placed for bowel rest.   Final Clinical Impression(s) / ED Diagnoses Final diagnoses:  SBO (small bowel obstruction) ([place])    Rx / DC Orders ED Discharge Orders    None       Seraphina Mitchner, Gwenyth Allegra, MD 06/19/19 [phone]

## 2019-06-19 NOTE — ED Triage Notes (Signed)
Pt c/o abd pain with nausea and vomiting x 2 episodes. Denies diarrhea.

## 2019-06-19 NOTE — ED Notes (Signed)
Per hospitialist - pt does not need covid swab as she had a positive result in the last 90 days.

## 2019-06-19 NOTE — H&P (Signed)
Carrie Watkins is an 74 y.o. female.   Chief Complaint: Periumbilical pain, nausea, and vomiting. HPI: The patient is a 74 yr old woman who carries a past medical history significant for irritable bowel, Anemia, DM II, GERD, GI Bleed, hyperlipidemia, hypertension, hypothyroidism, anxiety, and depression. She has had her gallbladder removed.   The patient presented to [facility] yesterday evening after she developed periumbilical abdominal pain, nausea and vomiting. At [facility] the patient had normal vitals and laboratory indices, but a CT of the abdomen and pelvis demonstrated dilated distal small bowel with inflammation at the terminal ileum with thickening of the bowel wall. An NGT was placed resulting in some relief for the patient, although she finds the NGT to be quite uncomfortable. CT also demonstrated a left adnexal cyst that demonstrated interval enlargement. Recommendation for that was for outpatient pelvic ultrasound and evaluation by OB/Gyn.   She has been started on IV Rocephin and Flagyl and pain control. General surgery will be called to consult on the patient in the morning due to abnormal appearance of the terminal ileum on CT.  The patient denies fevers, although she states that she had chills yesterday. She has had abdominal distention, nausea, vomiting and pain. No diarrhea or constipation. She denies chest pain, shortness of breath, cough, or coryza. She was diagnosed with COVID 19 on 18 December. She denies any neurological deficits, headaches, or confusion. There has been no pain or discoloration in her extremities.  No rashes, lesions, or sores.  Past Medical History:  Diagnosis Date  . Anemia   . Diabetes mellitus without complication ([place])   . GERD (gastroesophageal reflux disease)   . GI bleed   . High cholesterol   . Hypertension   . Thyroid disease     History reviewed. No pertinent surgical history.  No family history on file. Social History:  reports  that she has never smoked. She has never used smokeless tobacco. She reports that she does not drink alcohol. No history on file for drug. Medications Prior to Admission  Medication Sig Dispense Refill  . ALPRAZolam (XANAX) 0.5 MG tablet Take 0.5 mg by mouth 2 (two) times daily.    . cholecalciferol (VITAMIN D3) 25 MCG (1000 UNIT) tablet Take 1,000 Units by mouth daily.    Marland Kitchen dicyclomine (BENTYL) 10 MG capsule Take 1 capsule (10 mg total) by mouth 3 (three) times daily as needed for spasms. 20 capsule 0  . levothyroxine (SYNTHROID) 112 MCG tablet Take 112 mcg by mouth every morning.    Marland Kitchen LORazepam (ATIVAN) 0.5 MG tablet Take 0.5 mg by mouth every 8 (eight) hours.    . metFORMIN (GLUCOPHAGE-XR) 500 MG 24 hr tablet Take 1,000 mg by mouth daily.    Marland Kitchen omeprazole (PRILOSEC) 40 MG capsule Take 40 mg by mouth daily.    . sertraline (ZOLOFT) 50 MG tablet Take 50 mg by mouth daily.    . simvastatin (ZOCOR) 10 MG tablet Take 10 mg by mouth daily.    . valsartan (DIOVAN) 160 MG tablet Take 160 mg by mouth daily.      Allergies:  Allergies  Allergen Reactions  . Ciprofloxacin Other (See Comments)    Was told could not take this per  Urologist Was told could not take this per  Urologist   . Prozac [Fluoxetine Hcl]     Pertinent items noted in HPI and remainder of comprehensive ROS otherwise negative.   General appearance: alert, cooperative and mild distress Head: Normocephalic, without  obvious abnormality, atraumatic Eyes: conjunctivae/corneas clear. PERRL, EOM's intact. Fundi benign. Throat: lips, mucosa, and tongue normal; teeth and gums normal Neck: no adenopathy, no carotid bruit, no JVD, supple, symmetrical, trachea midline and thyroid not enlarged, symmetric, no tenderness/mass/nodules Resp: No increased work of breathing. No wheezes, rales, or rhonchi. No tactile fremitus. Chest wall: no tenderness Cardio: regular rate and rhythm, S1, S2 normal, no murmur, click, rub or gallop GI: Soft,  but moderately distended. Periumbilical tenderness. Hypoactive bowel sounds.  Extremities: extremities normal, atraumatic, no cyanosis or edema Pulses: 2+ and symmetric Skin: Skin color, texture, turgor normal. No rashes or lesions Lymph nodes: Cervical, supraclavicular, and axillary nodes normal. Neurologic: Alert and oriented X 3, normal strength and tone. Normal symmetric reflexes. Normal coordination and gait  Results for orders placed or performed during the hospital encounter of 06/19/19 (from the past 48 hour(s))  CBC with Differential/Platelet     Status: None   Collection Time: 06/19/19  3:00 AM  Result Value Ref Range   WBC 9.3 4.0 - 10.5 K/uL   RBC 4.47 3.87 - 5.11 MIL/uL   Hemoglobin 13.2 12.0 - 15.0 g/dL   HCT 41.7 36.0 - 46.0 %   MCV 93.3 80.0 - 100.0 fL   MCH 29.5 26.0 - 34.0 pg   MCHC 31.7 30.0 - 36.0 g/dL   RDW 12.0 11.5 - 15.5 %   Platelets 223 150 - 400 K/uL   nRBC 0.0 0.0 - 0.2 %   Neutrophils Relative % 80 %   Neutro Abs 7.3 1.7 - 7.7 K/uL   Lymphocytes Relative 15 %   Lymphs Abs 1.4 0.7 - 4.0 K/uL   Monocytes Relative 5 %   Monocytes Absolute 0.5 0.1 - 1.0 K/uL   Eosinophils Relative 0 %   Eosinophils Absolute 0.0 0.0 - 0.5 K/uL   Basophils Relative 0 %   Basophils Absolute 0.0 0.0 - 0.1 K/uL   Immature Granulocytes 0 %   Abs Immature Granulocytes 0.03 0.00 - 0.07 K/uL    Comment: Performed at [facility], [address] [place], [place] [address]  Comprehensive metabolic panel     Status: Abnormal   Collection Time: 06/19/19  3:00 AM  Result Value Ref Range   Sodium 137 135 - 145 mmol/L   Potassium 3.8 3.5 - 5.1 mmol/L   Chloride 106 98 - 111 mmol/L   CO2 21 (L) 22 - 32 mmol/L   Glucose, Bld 169 (H) 70 - 99 mg/dL   BUN 14 8 - 23 mg/dL   Creatinine, Ser 0.59 0.44 - 1.00 mg/dL   Calcium 9.2 8.9 - 10.3 mg/dL   Total Protein 7.8 6.5 - 8.1 g/dL   Albumin 4.3 3.5 - 5.0 g/dL   AST 25 15 - 41 U/L   ALT 18 0 - 44 U/L   Alkaline Phosphatase  97 38 - 126 U/L   Total Bilirubin 1.0 0.3 - 1.2 mg/dL   GFR calc non Af Amer >60 >60 mL/min   GFR calc Af Amer >60 >60 mL/min   Anion gap 10 5 - 15    Comment: Performed at [facility], [address] [place], [place] [address]  Lipase, blood     Status: None   Collection Time: 06/19/19  3:00 AM  Result Value Ref Range   Lipase 19 11 - 51 U/L    Comment: Performed at [facility], [address] [place], [place] [address]  Urinalysis,  Routine w reflex microscopic     Status: Abnormal   Collection Time: 06/19/19  3:52 AM  Result Value Ref Range   Color, Urine YELLOW YELLOW   APPearance CLEAR CLEAR   Specific Gravity, Urine >1.030 (H) 1.005 - 1.030   pH 6.0 5.0 - 8.0   Glucose, UA NEGATIVE NEGATIVE mg/dL   Hgb urine dipstick NEGATIVE NEGATIVE   Bilirubin Urine NEGATIVE NEGATIVE   Ketones, ur 15 (A) NEGATIVE mg/dL   Protein, ur 30 (A) NEGATIVE mg/dL   Nitrite NEGATIVE NEGATIVE   Leukocytes,Ua SMALL (A) NEGATIVE    Comment: Performed at [facility], [address] [place], [place] [address]  Urinalysis, Microscopic (reflex)     Status: Abnormal   Collection Time: 06/19/19  3:52 AM  Result Value Ref Range   RBC / HPF 0-5 0 - 5 RBC/hpf   WBC, UA 11-20 0 - 5 WBC/hpf   Bacteria, UA MANY (A) NONE SEEN   Squamous Epithelial / LPF 6-10 0 - 5   Mucus PRESENT     Comment: Performed at [facility], [address] [place], [place] [address]   @[email or web address] @  Blood pressure (!) 164/77, pulse 70, temperature 98.2 F (36.8 C), temperature source Oral, resp. rate 18, height 5' 4"  (1.626 m), weight 74 kg, SpO2 92 %.    Assessment/Plan Problem  Small Bowel Obstruction, Partial ([facility])  Obstruction of Bowel ([facility])  Abnormal Computed Tomography of Cecum and Terminal Ileum  Abdominal Pain  Nausea and Vomiting  Diabetes Mellitus Type 2, Controlled, Without Complications ([facility])  Essential Hypertension  Hypothyroidism   Small Bowel Obstruction:  Possibly related to abnormal appearance of terminal ileum on CT with thickened colon wall. The patient has had an NGT placed. She is receiving IV antibiotics, antiemetics, and pain control. She appears stable at this time. Will call surgical consult in the am.  DM II: The patient is NPO due to SBO. Will place her on a hypoglycemic protocol as well as FSBS and SSI. I will also include some D5 in her IV fluids. Metformin will be held.  Hypertension: Blood pressures are elevated. Will give the patient IV hydralazine to cover her blood pressures.  Anxiety and Depression: Oral antidepressant will be held, but will make ativan available to the patient on an as needed basis.  Hypothyroidism: Will give the patient IV synthroid.  GERD: IV Protonix.  I have seen and examined this patient myself. I have spent 78 minutes in her evaluation and care.  CODE STATUS: Full Code DVT Prophylaxis: SCD's Family Communication: Husband at bedside Disposition: [place] 06/19/2019, 5:25 PM

## 2019-06-20 ENCOUNTER — Inpatient Hospital Stay (HOSPITAL_COMMUNITY): Payer: Medicare Other

## 2019-06-20 DIAGNOSIS — U071 COVID-19: Secondary | ICD-10-CM | POA: Diagnosis present

## 2019-06-20 LAB — BASIC METABOLIC PANEL
Anion gap: 9 (ref 5–15)
BUN: 13 mg/dL (ref 8–23)
CO2: 26 mmol/L (ref 22–32)
Calcium: 8.5 mg/dL — ABNORMAL LOW (ref 8.9–10.3)
Chloride: 104 mmol/L (ref 98–111)
Creatinine, Ser: 0.65 mg/dL (ref 0.44–1.00)
GFR calc Af Amer: >60 mL/min (ref >60)
GFR calc non Af Amer: >60 mL/min (ref >60)
Glucose, Bld: 135 mg/dL — ABNORMAL HIGH (ref 70–99)
Potassium: 3.3 mmol/L — ABNORMAL LOW (ref 3.5–5.1)
Sodium: 139 mmol/L (ref 135–145)

## 2019-06-20 LAB — CBC
HCT: 35.3 % — ABNORMAL LOW (ref 36.0–46.0)
Hemoglobin: 11.3 g/dL — ABNORMAL LOW (ref 12.0–15.0)
MCH: 30 pg (ref 26.0–34.0)
MCHC: 32 g/dL (ref 30.0–36.0)
MCV: 93.6 fL (ref 80.0–100.0)
Platelets: 204 10*3/uL (ref 150–400)
RBC: 3.77 MIL/uL — ABNORMAL LOW (ref 3.87–5.11)
RDW: 12.5 % (ref 11.5–15.5)
WBC: 9.5 10*3/uL (ref 4.0–10.5)
nRBC: 0 % (ref 0.0–0.2)

## 2019-06-20 LAB — GLUCOSE, CAPILLARY
Glucose-Capillary: 104 mg/dL — ABNORMAL HIGH (ref 70–99)
Glucose-Capillary: 106 mg/dL — ABNORMAL HIGH (ref 70–99)
Glucose-Capillary: 116 mg/dL — ABNORMAL HIGH (ref 70–99)
Glucose-Capillary: 117 mg/dL — ABNORMAL HIGH (ref 70–99)
Glucose-Capillary: 119 mg/dL — ABNORMAL HIGH (ref 70–99)
Glucose-Capillary: 93 mg/dL (ref 70–99)
Glucose-Capillary: 97 mg/dL (ref 70–99)

## 2019-06-20 LAB — URINE CULTURE: Culture: NO GROWTH

## 2019-06-20 LAB — POTASSIUM: Potassium: 3.5 mmol/L (ref 3.5–5.1)

## 2019-06-20 LAB — MAGNESIUM: Magnesium: 2.5 mg/dL — ABNORMAL HIGH (ref 1.7–2.4)

## 2019-06-20 MED ORDER — DIATRIZOATE MEGLUMINE & SODIUM 66-10 % PO SOLN
90.0000 mL | Freq: Once | ORAL | Status: AC
  Administered 2019-06-20: 90 mL via NASOGASTRIC
  Filled 2019-06-20: qty 90

## 2019-06-20 MED ORDER — ENOXAPARIN SODIUM 40 MG/0.4ML ~~LOC~~ SOLN
40.0000 mg | SUBCUTANEOUS | Status: DC
  Administered 2019-06-20 – 2019-06-22 (×3): 40 mg via SUBCUTANEOUS
  Filled 2019-06-20 (×2): qty 0.4

## 2019-06-20 MED ORDER — METOCLOPRAMIDE HCL 5 MG/ML IJ SOLN
5.0000 mg | Freq: Once | INTRAMUSCULAR | Status: AC
  Administered 2019-06-20: 13:00:00 5 mg via INTRAVENOUS
  Filled 2019-06-20: qty 2

## 2019-06-20 MED ORDER — KETOROLAC TROMETHAMINE 15 MG/ML IJ SOLN
15.0000 mg | Freq: Once | INTRAMUSCULAR | Status: AC
  Administered 2019-06-20: 15 mg via INTRAVENOUS
  Filled 2019-06-20: qty 1

## 2019-06-20 MED ORDER — DIPHENHYDRAMINE HCL 50 MG/ML IJ SOLN
25.0000 mg | Freq: Once | INTRAMUSCULAR | Status: AC
  Administered 2019-06-20: 13:00:00 25 mg via INTRAVENOUS
  Filled 2019-06-20: qty 1

## 2019-06-20 MED ORDER — POTASSIUM CHLORIDE 10 MEQ/100ML IV SOLN
10.0000 meq | INTRAVENOUS | Status: AC
  Administered 2019-06-20 (×3): 10 meq via INTRAVENOUS
  Filled 2019-06-20 (×4): qty 100

## 2019-06-20 MED ORDER — MAGNESIUM SULFATE 2 GM/50ML IV SOLN
2.0000 g | Freq: Once | INTRAVENOUS | Status: AC
  Administered 2019-06-20: 16:00:00 2 g via INTRAVENOUS
  Filled 2019-06-20: qty 50

## 2019-06-20 NOTE — Progress Notes (Signed)
PROGRESS NOTE  Carrie Watkins JEH:[id number] DOB: 06-14-1945 DOA: 06/19/2019 PCP: [name], MD  HPI/Recap of past 24 hours: The patient is a 74 yr old woman with a past medical history significant for irritable bowel syndrome, Anemia, DM II, GERD, GI Bleed, hyperlipidemia, hypertension, hypothyroidism, anxiety, and depression, cholecystectomy, COVID-19 viral infection 05/15/19 treated at [facility].  Presented to [facility] yesterday evening after she developed periumbilical abdominal pain, nausea and vomiting.  CT of the abdomen and pelvis demonstrated dilated distal small bowel with inflammation at the terminal ileum with thickening of the bowel wall. An NGT was placed resulting in some relief of her symptoms.   CT also demonstrated a left adnexal cyst that demonstrated interval enlargement. Recommendation for that was for outpatient pelvic ultrasound and evaluation by OB/Gyn.   Empirically on IV Rocephin and IV Flagyl and pain control. Seen by General surgery plan to continue NGT, IV antibiotics and bowel rest at this time.   06/20/19: Seen and examined.  Reports improvement in her abdominal discomfort.  Persistent nausea.  NG tube in place to suction.    Assessment/Plan: Active Problems:   Diabetes mellitus type 2, controlled, without complications (HCC)   Essential hypertension   Hypothyroidism   Obstruction of bowel (HCC)   Abnormal computed tomography of cecum and terminal ileum   Abdominal pain   Nausea and vomiting   COVID-19 virus infection  Small bowel obstruction on CT scan NG tube placed in the ED to suction, continue Minimal output from NG tube today. Continue bowel rest Seen by general surgery with plan to continue NG tube, IV fluids, IV antibiotics, electrolyte replacement for now. Goal potassium 4 and magnesium 2. Daily BMPs Keep n.p.o. Continue IV fluid hydration Mobilize with assistance and fall precautions  Suspected terminal ileitis on  CT Continue Rocephin and Flagyl empirically Continue pain management  Hypokalemia Potassium 3.3 Repleted with IV KCl supplement Maintain potassium at least 4.0. Obtain magnesium level  Hypothyroidism Continue IV Synthroid  Type 2 diabetes Insulin sliding scale every 4 hours if needed Hold Metformin Last hemoglobin A1c 6.3 N.p.o. Avoid hypoglycemia  Chronic anxiety Hold p.o. medications Continue IV Ativan as needed  Intractable headache GI cocktail given with Toradol IV, IV Benadryl and IV Reglan once  History of COVID-19 viral infection Positive COVID-19 test on 05/15/2019 Treated at [facility] Completed 21-day of quarantine.  Essential hypertension Continue IV Vasotec 4 times daily.    Code Status: Full code  Family Communication: None at bedside  Disposition Plan: Patient is from home.  Anticipate discharge to home once general surgery signs off.  Barrier to discharge ongoing management for SBO.  She requires IV antibiotics and IV fluids for hydration.   Consultants:  General surgery  Procedures:  NG tube placement  Antimicrobials:  Rocephin and Flagyl  DVT prophylaxis: Subcu Lovenox daily   Objective: Vitals:   06/19/19 1400 06/19/19 1500 06/19/19 1610 06/20/19 0557  BP: (!) 164/61 (!) 175/79 (!) 164/77 (!) 179/75  Pulse: 69 73 70 65  Resp:   18 16  Temp:   98.2 F (36.8 C) (!) 97.4 F (36.3 C)  TempSrc:   Oral   SpO2: 91% 91% 92% 93%  Weight:   74 kg   Height:   5' 4"  (1.626 m)     Intake/Output Summary (Last 24 hours) at 06/20/2019 1333 Last data filed at 06/19/2019 1827 Gross per 24 hour  Intake 221.97 ml  Output --  Net 221.97 ml   Danley Danker  Weights   06/19/19 0252 06/19/19 1610  Weight: 72.6 kg 74 kg    Exam:  . General: 74 y.o. year-old female well developed well nourished in no acute distress.  Alert and oriented x3.  NG tube in place. . Cardiovascular: Regular rate and rhythm with no rubs or gallops.  No thyromegaly or JVD noted.    Marland Kitchen Respiratory: Clear to auscultation with no wheezes or rales. Good inspiratory effort. . Abdomen: Mildly distended with hypoactive bowel sounds.  Nontender on palpation. . Musculoskeletal: No lower extremity edema. 2/4 pulses in all 4 extremities. Marland Kitchen Psychiatry: Mood is appropriate for condition and setting   Data Reviewed: CBC: Recent Labs  Lab 06/19/19 0300 06/19/19 1712 06/20/19 0530  WBC 9.3 9.4 9.5  NEUTROABS 7.3 6.9  --   HGB 13.2 12.4 11.3*  HCT 41.7 38.6 35.3*  MCV 93.3 92.6 93.6  PLT 223 197 841   Basic Metabolic Panel: Recent Labs  Lab 06/19/19 0300 06/20/19 0530  NA 137 139  K 3.8 3.3*  CL 106 104  CO2 21* 26  GLUCOSE 169* 135*  BUN 14 13  CREATININE 0.59 0.65  CALCIUM 9.2 8.5*   GFR: Estimated Creatinine Clearance: 61.7 mL/min (by C-G formula based on SCr of 0.65 mg/dL). Liver Function Tests: Recent Labs  Lab 06/19/19 0300  AST 25  ALT 18  ALKPHOS 97  BILITOT 1.0  PROT 7.8  ALBUMIN 4.3   Recent Labs  Lab 06/19/19 0300  LIPASE 19   No results for input(s): AMMONIA in the last 168 hours. Coagulation Profile: No results for input(s): INR, PROTIME in the last 168 hours. Cardiac Enzymes: No results for input(s): CKTOTAL, CKMB, CKMBINDEX, TROPONINI in the last 168 hours. BNP (last 3 results) No results for input(s): PROBNP in the last 8760 hours. HbA1C: Recent Labs    06/19/19 1746  HGBA1C 6.3*   CBG: Recent Labs  Lab 06/19/19 1951 06/20/19 0017 06/20/19 0344 06/20/19 0757 06/20/19 1151  GLUCAP 124* 117* 119* 106* 116*   Lipid Profile: No results for input(s): CHOL, HDL, LDLCALC, TRIG, CHOLHDL, LDLDIRECT in the last 72 hours. Thyroid Function Tests: No results for input(s): TSH, T4TOTAL, FREET4, T3FREE, THYROIDAB in the last 72 hours. Anemia Panel: No results for input(s): VITAMINB12, FOLATE, FERRITIN, TIBC, IRON, RETICCTPCT in the last 72 hours. Urine analysis:    Component Value Date/Time   COLORURINE YELLOW 06/19/2019  0352   APPEARANCEUR CLEAR 06/19/2019 0352   LABSPEC >1.030 (H) 06/19/2019 0352   PHURINE 6.0 06/19/2019 0352   GLUCOSEU NEGATIVE 06/19/2019 0352   HGBUR NEGATIVE 06/19/2019 0352   BILIRUBINUR NEGATIVE 06/19/2019 0352   KETONESUR 15 (A) 06/19/2019 0352   PROTEINUR 30 (A) 06/19/2019 0352   UROBILINOGEN 0.2 12/18/2013 0936   NITRITE NEGATIVE 06/19/2019 0352   LEUKOCYTESUR SMALL (A) 06/19/2019 0352   Sepsis Labs: @LABRCNTIP (procalcitonin:4,lacticidven:4)  ) Recent Results (from the past 240 hour(s))  Urine Culture     Status: None   Collection Time: 06/19/19  3:52 AM   Specimen: Urine, Random  Result Value Ref Range Status   Specimen Description   Final    URINE, RANDOM Performed at [facility], [address] [place], [place] [address]    Special Requests   Final    NONE Performed at [facility], [address] [place], [place] [address]    Culture   Final    NO GROWTH Performed at [facility], [place] [address], [place], [place]  [address]    Report Status 06/20/2019 FINAL  Final      Studies: DG Abd Portable 1V  Result Date: 06/20/2019 CLINICAL DATA:  Abdominal pain EXAM: PORTABLE ABDOMEN - 1 VIEW COMPARISON:  Plain film of the abdomen dated 06/19/2019. FINDINGS: Overall bowel gas pattern is nonobstructive. Patulous loop of small bowel within the central abdomen to RIGHT upper quadrant, suspicious for small bowel wall thickening. No evidence of abnormal fluid collection or free intraperitoneal air. No evidence of renal or ureteral calculi. Cholecystectomy clips in RIGHT upper quadrant. Enteric tube projects to the level of the gastroesophageal junction. IMPRESSION: 1. Patulous loop of small bowel within the central abdomen extending to the RIGHT upper quadrant, suspicious for small bowel wall thickening/enteritis. 2. Enteric tube tip at the level of the gastroesophageal junction. Recommend advancing into the stomach. 3. No evidence of bowel  obstruction. Electronically Signed   By: Franki Cabot M.D.   On: 06/20/2019 10:42    Scheduled Meds: . enalaprilat  0.625 mg Intravenous Q6H  . insulin aspart  0-6 Units Subcutaneous Q4H  . levothyroxine  50 mcg Intravenous Daily  . pantoprazole (PROTONIX) IV  40 mg Intravenous Q24H    Continuous Infusions: . sodium chloride Stopped (06/19/19 1129)  . cefTRIAXone (ROCEPHIN)  IV 2 g (06/20/19 0544)  . dextrose 5% lactated ringers 100 mL/hr at 06/20/19 0542  . metronidazole 500 mg (06/20/19 0543)     LOS: 1 day     Kayleen Memos, MD [facility] Pager [phone]  If 7PM-7AM, please contact night-coverage [email or web address] Password [email or web address] 06/20/2019, 1:33 PM

## 2019-06-20 NOTE — Progress Notes (Signed)
NG tube was discontinued by patient when she went to the bathroom. Hospitalist notified and General Surgery on call MD was notified. Romana Juniper, MD said it was okay to leave NG Tube out unless the patient starts vomiting. Will continue to monitor.

## 2019-06-20 NOTE — Consult Note (Signed)
Surgical Consultation Requesting provider: Dr. Nevada Crane  CC: abdominal pain  HPI: 74 year old woman with history of laparoscopic cholecystectomy last year and medical history as outlined below who presented to [place] Thursday evening after developing periumbilical abdominal pain, nausea and vomiting.  CT scan was performed that evening that showed inflammation of the terminal ileum and possibly partial small bowel obstruction secondary to this.  She has been started on IV antibiotics and symptom directed supportive care.  Today she continues to complain of nausea and periumbilical pain.  Denies any flatus or bowel movements today, but did have a bowel movement yesterday.  She had Covid in December.  She does note a history of developing abdominal pain and GI symptoms related to anxiety and stress.  Allergies  Allergen Reactions  . Ciprofloxacin Other (See Comments)    Was told could not take this per  Urologist Was told could not take this per  Urologist   . Prozac [Fluoxetine Hcl]     Past Medical History:  Diagnosis Date  . Anemia   . Diabetes mellitus without complication ([place])   . GERD (gastroesophageal reflux disease)   . GI bleed   . High cholesterol   . Hypertension   . Thyroid disease     History reviewed. No pertinent surgical history.  No family history on file.  Social History   Socioeconomic History  . Marital status: Married    Spouse name: Not on file  . Number of children: Not on file  . Years of education: Not on file  . Highest education level: Not on file  Occupational History  . Not on file  Tobacco Use  . Smoking status: Never Smoker  . Smokeless tobacco: Never Used  Substance and Sexual Activity  . Alcohol use: No  . Drug use: Not on file  . Sexual activity: Not on file  Other Topics Concern  . Not on file  Social History Narrative  . Not on file   Social Determinants of Health   Financial Resource Strain:   . Difficulty of  Paying Living Expenses: Not on file  Food Insecurity:   . Worried About Charity fundraiser in the Last Year: Not on file  . Ran Out of Food in the Last Year: Not on file  Transportation Needs:   . Lack of Transportation (Medical): Not on file  . Lack of Transportation (Non-Medical): Not on file  Physical Activity:   . Days of Exercise per Week: Not on file  . Minutes of Exercise per Session: Not on file  Stress:   . Feeling of Stress : Not on file  Social Connections:   . Frequency of Communication with Friends and Family: Not on file  . Frequency of Social Gatherings with Friends and Family: Not on file  . Attends Religious Services: Not on file  . Active Member of Clubs or Organizations: Not on file  . Attends Archivist Meetings: Not on file  . Marital Status: Not on file    No current facility-administered medications on file prior to encounter.   Current Outpatient Medications on File Prior to Encounter  Medication Sig Dispense Refill  . ALPRAZolam (XANAX) 0.5 MG tablet Take 0.5 mg by mouth 2 (two) times daily.    . cholecalciferol (VITAMIN D3) 25 MCG (1000 UNIT) tablet Take 1,000 Units by mouth daily.    Marland Kitchen dicyclomine (BENTYL) 10 MG capsule Take 1 capsule (10 mg total) by mouth 3 (three) times daily  as needed for spasms. 20 capsule 0  . levothyroxine (SYNTHROID) 112 MCG tablet Take 112 mcg by mouth every morning.    Marland Kitchen LORazepam (ATIVAN) 0.5 MG tablet Take 0.5 mg by mouth every 8 (eight) hours.    . metFORMIN (GLUCOPHAGE-XR) 500 MG 24 hr tablet Take 1,000 mg by mouth daily.    Marland Kitchen omeprazole (PRILOSEC) 40 MG capsule Take 40 mg by mouth daily.    . sertraline (ZOLOFT) 50 MG tablet Take 50 mg by mouth daily.    . simvastatin (ZOCOR) 10 MG tablet Take 10 mg by mouth daily.    . valsartan (DIOVAN) 160 MG tablet Take 160 mg by mouth daily.      Review of Systems: a complete, 10pt review of systems was completed with pertinent positives and negatives as documented in the  HPI  Physical Exam: Vitals:   06/19/19 1610 06/20/19 0557  BP: (!) 164/77 (!) 179/75  Pulse: 70 65  Resp: 18 16  Temp: 98.2 F (36.8 C) (!) 97.4 F (36.3 C)  SpO2: 92% 93%   Gen: A&Ox3, no distress  Head: normocephalic, atraumatic Eyes: extraocular motions intact, anicteric.  Neck: supple without mass or thyromegaly Chest: unlabored respirations, symmetrical air entry  Cardiovascular: RRR with palpable distal pulses, no pedal edema Abdomen: soft, nondistended, mildly diffusely tender without peritonitis. No mass or organomegaly.  Extremities: warm, without edema, no deformities  Neuro: grossly intact Psych: appropriate mood and affect, normal insight  Skin: warm and dry NG tube in place with minimal output.   CBC Latest Ref Rng & Units 06/20/2019 06/19/2019 06/19/2019  WBC 4.0 - 10.5 K/uL 9.5 9.4 9.3  Hemoglobin 12.0 - 15.0 g/dL 11.3(L) 12.4 13.2  Hematocrit 36.0 - 46.0 % 35.3(L) 38.6 41.7  Platelets 150 - 400 K/uL 204 197 223    CMP Latest Ref Rng & Units 06/20/2019 06/19/2019 03/03/2017  Glucose 70 - 99 mg/dL 135(H) 169(H) 129(H)  BUN 8 - 23 mg/dL 13 14 13   Creatinine 0.44 - 1.00 mg/dL 0.65 0.59 0.70  Sodium 135 - 145 mmol/L 139 137 137  Potassium 3.5 - 5.1 mmol/L 3.3(L) 3.8 3.7  Chloride 98 - 111 mmol/L 104 106 107  CO2 22 - 32 mmol/L 26 21(L) 23  Calcium 8.9 - 10.3 mg/dL 8.5(L) 9.2 8.8(L)  Total Protein 6.5 - 8.1 g/dL - 7.8 -  Total Bilirubin 0.3 - 1.2 mg/dL - 1.0 -  Alkaline Phos 38 - 126 U/L - 97 -  AST 15 - 41 U/L - 25 -  ALT 0 - 44 U/L - 18 -    No results found for: INR, PROTIME  Imaging: CT ABDOMEN PELVIS W CONTRAST  Result Date: 06/19/2019 CLINICAL DATA:  Abdominal pain. Nausea and vomiting. EXAM: CT ABDOMEN AND PELVIS WITH CONTRAST TECHNIQUE: Multidetector CT imaging of the abdomen and pelvis was performed using the standard protocol following bolus administration of intravenous contrast. CONTRAST:  143m OMNIPAQUE IOHEXOL 300 MG/ML  SOLN COMPARISON:  CT  of the abdomen and pelvis without contrast 10/05/2016. CT abdomen pelvis without and with contrast 08/24/2015 FINDINGS: Lower chest: Linear atelectasis or scarring is present in the left lower lobe. The heart size is normal. No significant pleural or pericardial effusion is present. Hepatobiliary: Multiple bilateral static cysts are again seen. Cholecystectomy is noted. The common bile duct is within normal limits. Pancreas: Unremarkable. No pancreatic ductal dilatation or surrounding inflammatory changes. Spleen: Normal in size without focal abnormality. Adrenals/Urinary Tract: A right adrenal adenoma is stable. The left adrenal  gland is normal. Kidneys and ureters are within limits. The urinary bladder is mostly collapsed. Stomach/Bowel: Stomach and duodenum are within limits. Distal loops of small bowel progressively dilated to 3.7 cm. There is marked wall thickening in the terminal ileum. No mass lesion is present. The appendix is within normal limits. The ascending colon is within normal limits. Diverticula are present throughout the colon beginning in the transverse colon and most prominent in the sigmoid colon. No focal inflammatory changes present about the colon. Vascular/Lymphatic: Atherosclerotic calcifications are present the aorta without aneurysm. No significant adenopathy is present. Reproductive: A progressively enlarging left adnexal cystic lesion now measures 4.2 x 3.6 x 2.6 cm. Uterus and right adnexa are within normal limits. Other: Free fluid is present within the anatomic pelvis. No free air is present. No significant ventral hernia is present. Musculoskeletal: Degenerative facet changes are present in the lower lumbar spine. No focal lytic or blastic lesions are present. Bony pelvis is within normal limits. The hips are located and within normal limits. IMPRESSION: 1. Distal small bowel obstruction secondary inflammatory changes in the terminal ileum raising concern for inflammatory bowel  disease. Focal infection is also considered. 2. Free fluid within the anatomic pelvis is likely reactive. 3. Progressive enlargement of left adnexal cystic lesion now measuring 4.2 x 3.6 x 2.6 cm. Given the interval growth of this lesion and the patient's age, follow-up gyn referral and ultrasound is recommended. 4. Stable hepatic cysts. 5. Stable right adrenal adenoma. Electronically Signed   By: San Morelle M.D.   On: 06/19/2019 04:34   DG Abd Portable 1 View  Result Date: 06/19/2019 CLINICAL DATA:  NG tube placement. EXAM: PORTABLE ABDOMEN - 1 VIEW COMPARISON:  CT 06/19/2019.  Chest x-ray 11/08/2009. FINDINGS: NG tube noted with tip and side hole in the stomach. No bowel distention or free air noted. Calcified nodule over right mid lung consistent with granuloma again noted. IMPRESSION: NG tube noted with tip and side hole in the stomach. No bowel distention or free air noted. Reference is made to prior CT report of 06/19/2019 for discussion of small-bowel obstruction. Electronically Signed   By: Marcello Moores  Register   On: 06/19/2019 05:41     A/P: 74 year old woman with abdominal pain, nausea and vomiting, findings concerning for terminal ileitis on CT.  Clinically does not seem to be obstructed.  We will proceed with small bowel obstruction protocol.  We will continue to follow.  Patient Active Problem List   Diagnosis Date Noted  . COVID-19 virus infection 06/20/2019  . Small bowel obstruction, partial ([place]) 06/19/2019  . Obstruction of bowel ([place]) 06/19/2019  . Abnormal computed tomography of cecum and terminal ileum 06/19/2019  . Abdominal pain 06/19/2019  . Nausea and vomiting 06/19/2019  . Mixed hyperlipidemia 02/04/2015  . Vitamin D deficiency 04/01/2014  . Diabetes mellitus type 2, controlled, without complications ([place]) 16/02/9603  . Essential hypertension 01/13/2014  . Hypothyroidism 01/13/2014       Romana Juniper, MD [facility]  See AMION to  contact appropriate on-call provider

## 2019-06-21 ENCOUNTER — Inpatient Hospital Stay (HOSPITAL_COMMUNITY): Payer: Medicare Other

## 2019-06-21 LAB — GLUCOSE, CAPILLARY
Glucose-Capillary: 101 mg/dL — ABNORMAL HIGH (ref 70–99)
Glucose-Capillary: 101 mg/dL — ABNORMAL HIGH (ref 70–99)
Glucose-Capillary: 103 mg/dL — ABNORMAL HIGH (ref 70–99)
Glucose-Capillary: 106 mg/dL — ABNORMAL HIGH (ref 70–99)
Glucose-Capillary: 107 mg/dL — ABNORMAL HIGH (ref 70–99)
Glucose-Capillary: 109 mg/dL — ABNORMAL HIGH (ref 70–99)

## 2019-06-21 LAB — BASIC METABOLIC PANEL
Anion gap: 7 (ref 5–15)
BUN: 9 mg/dL (ref 8–23)
CO2: 28 mmol/L (ref 22–32)
Calcium: 8.4 mg/dL — ABNORMAL LOW (ref 8.9–10.3)
Chloride: 106 mmol/L (ref 98–111)
Creatinine, Ser: 0.58 mg/dL (ref 0.44–1.00)
GFR calc Af Amer: >60 mL/min (ref >60)
GFR calc non Af Amer: >60 mL/min (ref >60)
Glucose, Bld: 121 mg/dL — ABNORMAL HIGH (ref 70–99)
Potassium: 3.8 mmol/L (ref 3.5–5.1)
Sodium: 141 mmol/L (ref 135–145)

## 2019-06-21 LAB — CBC
HCT: 36.8 % (ref 36.0–46.0)
Hemoglobin: 11.7 g/dL — ABNORMAL LOW (ref 12.0–15.0)
MCH: 30 pg (ref 26.0–34.0)
MCHC: 31.8 g/dL (ref 30.0–36.0)
MCV: 94.4 fL (ref 80.0–100.0)
Platelets: 191 10*3/uL (ref 150–400)
RBC: 3.9 MIL/uL (ref 3.87–5.11)
RDW: 12.6 % (ref 11.5–15.5)
WBC: 6.7 10*3/uL (ref 4.0–10.5)
nRBC: 0 % (ref 0.0–0.2)

## 2019-06-21 LAB — MAGNESIUM: Magnesium: 2 mg/dL (ref 1.7–2.4)

## 2019-06-21 MED ORDER — IRBESARTAN 75 MG PO TABS: 75.0000 mg | ORAL_TABLET | Freq: Every day | ORAL | Status: DC

## 2019-06-21 MED ORDER — IRBESARTAN 75 MG PO TABS
75.0000 mg | ORAL_TABLET | Freq: Every day | ORAL | Status: DC
  Administered 2019-06-21: 75 mg via ORAL
  Filled 2019-06-21: qty 1

## 2019-06-21 MED ORDER — LORAZEPAM 0.5 MG PO TABS
0.5000 mg | ORAL_TABLET | Freq: Three times a day (TID) | ORAL | Status: DC
  Administered 2019-06-21 – 2019-06-23 (×7): 0.5 mg via ORAL
  Filled 2019-06-21 (×7): qty 1

## 2019-06-21 MED ORDER — ALPRAZOLAM 0.5 MG PO TABS
0.5000 mg | ORAL_TABLET | Freq: Two times a day (BID) | ORAL | Status: DC
  Administered 2019-06-21 – 2019-06-23 (×5): 0.5 mg via ORAL
  Filled 2019-06-21 (×5): qty 1

## 2019-06-21 MED ORDER — POTASSIUM CHLORIDE 10 MEQ/100ML IV SOLN
10.0000 meq | INTRAVENOUS | Status: AC
  Administered 2019-06-21 (×2): 10 meq via INTRAVENOUS
  Filled 2019-06-21: qty 100

## 2019-06-21 MED ORDER — POTASSIUM CHLORIDE 10 MEQ/100ML IV SOLN
INTRAVENOUS | Status: AC
  Administered 2019-06-21: 09:00:00 10 meq
  Filled 2019-06-21: qty 100

## 2019-06-21 MED ORDER — DIPHENHYDRAMINE HCL 50 MG/ML IJ SOLN
25.0000 mg | Freq: Once | INTRAMUSCULAR | Status: AC
  Administered 2019-06-21: 25 mg via INTRAVENOUS
  Filled 2019-06-21: qty 1

## 2019-06-21 MED ORDER — ENALAPRILAT 1.25 MG/ML IV SOLN
1.2500 mg | Freq: Four times a day (QID) | INTRAVENOUS | Status: DC
  Filled 2019-06-21: qty 1

## 2019-06-21 MED ORDER — DICYCLOMINE HCL 10 MG PO CAPS: 10.0000 mg | ORAL_CAPSULE | Freq: Three times a day (TID) | ORAL | Status: DC | PRN

## 2019-06-21 MED ORDER — SERTRALINE HCL 50 MG PO TABS
50.0000 mg | ORAL_TABLET | Freq: Every day | ORAL | Status: DC
  Administered 2019-06-21 – 2019-06-23 (×3): 50 mg via ORAL
  Filled 2019-06-21 (×3): qty 1

## 2019-06-21 MED ORDER — BUTALBITAL-APAP-CAFFEINE 50-325-40 MG PO TABS
1.0000 | ORAL_TABLET | Freq: Once | ORAL | Status: AC
  Administered 2019-06-21: 08:00:00 1 via ORAL
  Filled 2019-06-21: qty 1

## 2019-06-21 MED ORDER — LEVOTHYROXINE SODIUM 112 MCG PO TABS
112.0000 ug | ORAL_TABLET | Freq: Every day | ORAL | Status: DC
  Administered 2019-06-22 – 2019-06-23 (×2): 112 ug via ORAL
  Filled 2019-06-21 (×2): qty 1

## 2019-06-21 MED ORDER — HYDRALAZINE HCL 20 MG/ML IJ SOLN
5.0000 mg | Freq: Four times a day (QID) | INTRAMUSCULAR | Status: DC | PRN
  Administered 2019-06-21: 5 mg via INTRAVENOUS
  Filled 2019-06-21: qty 1

## 2019-06-21 MED ORDER — LORAZEPAM 0.5 MG PO TABS
0.5000 mg | ORAL_TABLET | Freq: Once | ORAL | Status: AC | PRN
  Administered 2019-06-21: 0.5 mg via ORAL
  Filled 2019-06-21: qty 1

## 2019-06-21 MED ORDER — SIMVASTATIN 10 MG PO TABS
10.0000 mg | ORAL_TABLET | Freq: Every day | ORAL | Status: DC
  Administered 2019-06-21 – 2019-06-23 (×3): 10 mg via ORAL
  Filled 2019-06-21 (×3): qty 1

## 2019-06-21 MED ORDER — PANTOPRAZOLE SODIUM 40 MG PO TBEC
40.0000 mg | DELAYED_RELEASE_TABLET | Freq: Every day | ORAL | Status: DC
  Administered 2019-06-21 – 2019-06-23 (×3): 40 mg via ORAL
  Filled 2019-06-21 (×3): qty 1

## 2019-06-21 MED ORDER — VITAMIN D 25 MCG (1000 UNIT) PO TABS
1000.0000 [IU] | ORAL_TABLET | Freq: Every day | ORAL | Status: DC
  Administered 2019-06-21 – 2019-06-23 (×3): 1000 [IU] via ORAL
  Filled 2019-06-21 (×3): qty 1

## 2019-06-21 NOTE — Evaluation (Signed)
Physical Therapy Evaluation Patient Details Name: Carrie Watkins MRN: [id number] DOB: 07-20-1945 Today's Date: 06/21/2019   History of Present Illness  The patient is a 74 yr old woman with a past medical history significant for irritable bowel syndrome, Anemia, DM II, GERD, GI Bleed, hyperlipidemia, hypertension, hypothyroidism, anxiety, and depression, cholecystectomy, COVID-19 viral infection 05/15/19 treated at [facility]. Presented to [facility] yesterday evening after she developed periumbilical abdominal pain, nausea and vomiting.  CT of the abdomen and pelvis demonstrated dilated distal small bowel with inflammation at the terminal ileum with thickening of the bowel wall. An NGT was placed resulting in some relief of her symptoms.   CT also demonstrated a left adnexal cyst that demonstrated interval enlargement. Recommendation for that was for outpatient pelvic ultrasound and evaluation by OB/Gyn.    Clinical Impression  Carrie Watkins is 74 y.o. female admitted with above HPI and diagnosis. Patient is currently limited by functional impairments below (see PT problem list). Patient lives with her husband and is independent at baseline. Patient does not require continued skilled Acute PT services and was educated on benefits of mobilizing with RN staff during acute stay to improve activity tolerance. Acute PT will sign off, please re-consult if there is a change in patients functional status.    Follow Up Recommendations No PT follow up    Equipment Recommendations  None recommended by PT    Recommendations for Other Services       Precautions / Restrictions Precautions Precautions: Fall Restrictions Weight Bearing Restrictions: No      Mobility  Bed Mobility Overal bed mobility: Modified Independent Bed Mobility: Supine to Sit;Sit to Supine     Supine to sit: Modified independent (Device/Increase time) Sit to supine: Modified independent (Device/Increase time)    General bed mobility comments: pt taking extra time to mobilize, no assist required  Transfers Overall transfer level: Needs assistance Equipment used: None Transfers: Sit to/from Stand Sit to Stand: Modified independent (Device/Increase time)         General transfer comment: no cues necessary for safety, pt able to perform power up and rise without assist  Ambulation/Gait Ambulation/Gait assistance: Supervision Gait Distance (Feet): 80 Feet Assistive device: IV Pole;None Gait Pattern/deviations: Step-through pattern;Decreased stride length Gait velocity: decreased   General Gait Details: cues for safe use of IV pole to steady. no overt LOB noted.  Stairs            Wheelchair Mobility    Modified Rankin (Stroke Patients Only)       Balance Overall balance assessment: Mild deficits observed, not formally tested              Pertinent Vitals/Pain Pain Assessment: Faces Faces Pain Scale: Hurts a little bit Pain Location: abdomen Pain Descriptors / Indicators: Aching Pain Intervention(s): Limited activity within patient's tolerance;Monitored during session    [place] expects to be discharged to:: Private residence Living Arrangements: Spouse/significant other Available Help at Discharge: Family Type of Home: House Home Access: Stairs to enter Entrance Stairs-Rails: Psychiatric nurse of Steps: 2 Home Layout: Two level;Able to live on main level with bedroom/bathroom;Full bath on main level Home Equipment: Other (comment) Additional Comments: pt has a stationary bike at home for exercise. she is having her bathroom renovated soon and will have a walk-in shower wtih grab bars once done.    Prior Function Level of Independence: Independent               Hand Dominance  Dominant Hand: Right    Extremity/Trunk Assessment   Upper Extremity Assessment Upper Extremity Assessment: Overall WFL for tasks assessed     Lower Extremity Assessment Lower Extremity Assessment: Overall WFL for tasks assessed    Cervical / Trunk Assessment Cervical / Trunk Assessment: Normal  Communication   Communication: No difficulties  Cognition Arousal/Alertness: Awake/alert Behavior During Therapy: WFL for tasks assessed/performed Overall Cognitive Status: Within Functional Limits for tasks assessed         General Comments      Exercises Other Exercises Other Exercises: 5 Time Sit<>Stand Test: pt performed in 25.5 seconds without use of UE's for power up, pt did use some momentum to rise each time.   Assessment/Plan    PT Assessment Patent does not need any further PT services  PT Problem List Decreased strength;Decreased mobility;Decreased activity tolerance       PT Treatment Interventions DME instruction;Functional mobility training;Balance training;Patient/family education;Therapeutic activities;Gait training;Therapeutic exercise    PT Goals (Current goals can be found in the Care Plan section)  Acute Rehab PT Goals Patient Stated Goal: to return home PT Goal Formulation: With patient Time For Goal Achievement: 06/28/19 Potential to Achieve Goals: Good    Frequency Other (Comment)(1x treat/eval)    AM-PAC PT "6 Clicks" Mobility  Outcome Measure Help needed turning from your back to your side while in a flat bed without using bedrails?: None Help needed moving from lying on your back to sitting on the side of a flat bed without using bedrails?: None Help needed moving to and from a bed to a chair (including a wheelchair)?: None Help needed standing up from a chair using your arms (e.g., wheelchair or bedside chair)?: None Help needed to walk in hospital room?: A Little Help needed climbing 3-5 steps with a railing? : A Little 6 Click Score: 22    End of Session Equipment Utilized During Treatment: Gait belt Activity Tolerance: Patient tolerated treatment well Patient left: in bed;with  call bell/phone within reach Nurse Communication: Mobility status PT Visit Diagnosis: Muscle weakness (generalized) (M62.81);Difficulty in walking, not elsewhere classified (R26.2)    Time: 6734-1937 PT Time Calculation (min) (ACUTE ONLY): 16 min   Charges:   PT Evaluation $PT Eval Low Complexity: 1 Low         Verner Mould, DPT Physical Therapist with [facility] [phone]  06/21/2019 12:22 PM

## 2019-06-21 NOTE — Progress Notes (Signed)
PROGRESS NOTE  Carrie Watkins BDZ:[id number] DOB: Jul 28, 1945 DOA: 06/19/2019 PCP: [name], MD  HPI/Recap of past 24 hours: The patient is a 74 yr old woman with a past medical history significant for irritable bowel syndrome, Anemia, DM II, GERD, GI Bleed, hyperlipidemia, hypertension, hypothyroidism, anxiety, and depression, cholecystectomy, COVID-19 viral infection 05/15/19 treated at [facility].  Presented to [facility] yesterday evening after she developed periumbilical abdominal pain, nausea and vomiting.  CT of the abdomen and pelvis demonstrated dilated distal small bowel with inflammation at the terminal ileum with thickening of the bowel wall. An NGT was placed resulting in some relief of her symptoms.   CT also demonstrated a left adnexal cyst that demonstrated interval enlargement. Recommendation for that was for outpatient pelvic ultrasound and evaluation by OB/Gyn.   Empirically on IV Rocephin and IV Flagyl and pain control. Seen by General surgery plan to continue NGT, IV antibiotics and bowel rest at this time.   06/21/19: Seen and examined.  Reports feeling hungry.  Denies abdominal pain or nausea.  2 bowel movement this morning with positive flatus.  Started on clear liquid diet.  Will advance diet as tolerated.  General surgery following, appreciate assistance.    Assessment/Plan: Active Problems:   Diabetes mellitus type 2, controlled, without complications (HCC)   Essential hypertension   Hypothyroidism   Obstruction of bowel (HCC)   Abnormal computed tomography of cecum and terminal ileum   Abdominal pain   Nausea and vomiting   COVID-19 virus infection  Resolving small bowel obstruction on CT scan likely transient partial obstruction from possible terminal ileitis now having bowel function. NG tube placed in the ED to suction, accidentally came out on 06/20/2019. Reports 2 bowel movements on 06/21/2019 and positive flatus. Electrolytes  stable. Started on clear diet, advance as tolerated Cut down IV fluid rate to 50 cc/h. Mobilize as tolerated with assistance and fall precautions.  Suspected terminal ileitis on CT Continue Rocephin and Flagyl empirically Continue pain management as needed  Resolved post repletion: Hypokalemia Potassium 3.3>> 3.8 Maintain potassium at least 4.0. Magnesium level 2.0.  Hypothyroidism Continue IV Synthroid  Type 2 diabetes Start insulin sliding scale Continue to hold oral hypoglycemic Metformin Last hemoglobin A1c 6.3 on 06/19/2019.   Started on clear liquid diet on 06/21/2019.  Chronic anxiety Restart p.o. medications  Intractable headache Received Fioricet x1 dose  History of COVID-19 viral infection Positive COVID-19 test on 05/15/2019 Treated at [facility] Completed 21-day of quarantine.  Essential hypertension Restart oral medications    Code Status: Full code  Family Communication: None at bedside  Disposition Plan: Patient is from home.  Anticipate discharge to home likely tomorrow 06/22/2019 or once general surgery signs off.  Barrier to discharge started clear liquid diet today, will need to tolerate a soft diet prior to discharge.  We will continue IV fluid today to avoid dehydration.   Consultants:  General surgery  Procedures:  NG tube placement 06/19/19--06/20/19  Antimicrobials:  Rocephin and Flagyl  DVT prophylaxis: Subcu Lovenox daily   Objective: Vitals:   06/20/19 0557 06/20/19 1457 06/20/19 2006 06/21/19 0522  BP: (!) 179/75 (!) 171/76 (!) 161/75 (!) 173/80  Pulse: 65 63 67 73  Resp: 16 15 17 16   Temp: (!) 97.4 F (36.3 C) 98.5 F (36.9 C) 97.8 F (36.6 C) 97.7 F (36.5 C)  TempSrc:  Oral Oral Oral  SpO2: 93% 98% 98% 93%  Weight:      Height:  Intake/Output Summary (Last 24 hours) at 06/21/2019 1214 Last data filed at 06/21/2019 0745 Gross per 24 hour  Intake 2282.64 ml  Output 150 ml  Net 2132.64 ml   Filed Weights    06/19/19 0252 06/19/19 1610  Weight: 72.6 kg 74 kg    Exam:  . General: 74 y.o. year-old female pleasant well-developed well-nourished no acute distress.  Alert and oriented x3.. . Cardiovascular: Regular rate and rhythm no rubs or gallops. Marland Kitchen Respiratory: Clear to auscultation with no wheezes or rales.  Good inspiratory effort. . Abdomen: Mildly tender with palpation at the lower and right upper quadrants.  Bowel sounds present.  .  Musculoskeletal: No lower extremity edema bilaterally. Marland Kitchen Psychiatry: Mood is appropriate for condition and setting.   Data Reviewed: CBC: Recent Labs  Lab 06/19/19 0300 06/19/19 1712 06/20/19 0530 06/21/19 0530  WBC 9.3 9.4 9.5 6.7  NEUTROABS 7.3 6.9  --   --   HGB 13.2 12.4 11.3* 11.7*  HCT 41.7 38.6 35.3* 36.8  MCV 93.3 92.6 93.6 94.4  PLT 223 197 204 536   Basic Metabolic Panel: Recent Labs  Lab 06/19/19 0300 06/20/19 0530 06/20/19 1823 06/21/19 0530  NA 137 139  --  141  K 3.8 3.3* 3.5 3.8  CL 106 104  --  106  CO2 21* 26  --  28  GLUCOSE 169* 135*  --  121*  BUN 14 13  --  9  CREATININE 0.59 0.65  --  0.58  CALCIUM 9.2 8.5*  --  8.4*  MG  --   --  2.5* 2.0   GFR: Estimated Creatinine Clearance: 61.7 mL/min (by C-G formula based on SCr of 0.58 mg/dL). Liver Function Tests: Recent Labs  Lab 06/19/19 0300  AST 25  ALT 18  ALKPHOS 97  BILITOT 1.0  PROT 7.8  ALBUMIN 4.3   Recent Labs  Lab 06/19/19 0300  LIPASE 19   No results for input(s): AMMONIA in the last 168 hours. Coagulation Profile: No results for input(s): INR, PROTIME in the last 168 hours. Cardiac Enzymes: No results for input(s): CKTOTAL, CKMB, CKMBINDEX, TROPONINI in the last 168 hours. BNP (last 3 results) No results for input(s): PROBNP in the last 8760 hours. HbA1C: Recent Labs    06/19/19 1746  HGBA1C 6.3*   CBG: Recent Labs  Lab 06/20/19 1601 06/20/19 1930 06/20/19 2345 06/21/19 0324 06/21/19 0750  GLUCAP 93 97 104* 101* 103*   Lipid  Profile: No results for input(s): CHOL, HDL, LDLCALC, TRIG, CHOLHDL, LDLDIRECT in the last 72 hours. Thyroid Function Tests: No results for input(s): TSH, T4TOTAL, FREET4, T3FREE, THYROIDAB in the last 72 hours. Anemia Panel: No results for input(s): VITAMINB12, FOLATE, FERRITIN, TIBC, IRON, RETICCTPCT in the last 72 hours. Urine analysis:    Component Value Date/Time   COLORURINE YELLOW 06/19/2019 0352   APPEARANCEUR CLEAR 06/19/2019 0352   LABSPEC >1.030 (H) 06/19/2019 0352   PHURINE 6.0 06/19/2019 0352   GLUCOSEU NEGATIVE 06/19/2019 0352   HGBUR NEGATIVE 06/19/2019 0352   BILIRUBINUR NEGATIVE 06/19/2019 0352   KETONESUR 15 (A) 06/19/2019 0352   PROTEINUR 30 (A) 06/19/2019 0352   UROBILINOGEN 0.2 12/18/2013 0936   NITRITE NEGATIVE 06/19/2019 0352   LEUKOCYTESUR SMALL (A) 06/19/2019 0352   Sepsis Labs: @LABRCNTIP (procalcitonin:4,lacticidven:4)  ) Recent Results (from the past 240 hour(s))  Urine Culture     Status: None   Collection Time: 06/19/19  3:52 AM   Specimen: Urine, Random  Result Value Ref Range Status  Specimen Description   Final    URINE, RANDOM Performed at [facility], [address] [place], [place] [address]    Special Requests   Final    NONE Performed at [facility], [address] [place], [place] [address]    Culture   Final    NO GROWTH Performed at [facility], [address] [place], [place] [address]    Report Status 06/20/2019 FINAL  Final      Studies: DG Abd Portable 1V-Small Bowel Obstruction Protocol-24 hr delay  Result Date: 06/21/2019 CLINICAL DATA:  History of small-bowel obstruction. EXAM: PORTABLE ABDOMEN - 1 VIEW COMPARISON:  Single-view of the abdomen 06/20/2019 06/19/2019. FINDINGS: The bowel gas pattern is normal. Single gas-filled but nondilated loop of small bowel noted. No radio-opaque calculi or other significant radiographic abnormality are seen. IMPRESSION: Negative exam. Electronically  Signed   By: Inge Rise M.D.   On: 06/21/2019 11:25   DG Abd Portable 1V-Small Bowel Obstruction Protocol-initial, 8 hr delay  Result Date: 06/20/2019 CLINICAL DATA:  Small bowel protocol, 8 hour delayed film. EXAM: PORTABLE ABDOMEN - 1 VIEW COMPARISON:  Radiographs earlier this day. CT yesterday. FINDINGS: Possible but not definite enteric contrast in the terminal ileum and cecum. No other enteric contrast in the colon. Previous prominent small bowel loop is no longer seen. The enteric tube is no longer seen. Cholecystectomy clips in the right upper quadrant. IMPRESSION: Possible but not definite enteric contrast in the terminal ileum and cecum. No other enteric contrast in the colon. Recommend 24 hour film. Previous prominent small bowel loop is no longer seen. Electronically Signed   By: Keith Rake M.D.   On: 06/20/2019 19:22    Scheduled Meds: . enalaprilat  1.25 mg Intravenous Q6H  . enoxaparin (LOVENOX) injection  40 mg Subcutaneous Q24H  . insulin aspart  0-6 Units Subcutaneous Q4H  . levothyroxine  50 mcg Intravenous Daily  . pantoprazole (PROTONIX) IV  40 mg Intravenous Q24H    Continuous Infusions: . sodium chloride Stopped (06/19/19 1129)  . cefTRIAXone (ROCEPHIN)  IV 2 g (06/21/19 3762)  . dextrose 5% lactated ringers 100 mL/hr at 06/21/19 0828  . metronidazole 500 mg (06/21/19 0538)     LOS: 2 days     Kayleen Memos, MD [facility] Pager [phone]  If 7PM-7AM, please contact night-coverage [email or web address] Password [facility] 06/21/2019, 12:14 PM

## 2019-06-21 NOTE — Progress Notes (Signed)
Subjective/Chief Complaint: NG tube accidentally removed yesterday afternoon.  Patient denies any nausea or bloating since this.  She has had a bowel movement and is passing flatus.   Objective: Vital signs in last 24 hours: Temp:  [97.7 F (36.5 C)-98.5 F (36.9 C)] 97.7 F (36.5 C) (01/24 0522) Pulse Rate:  [63-73] 73 (01/24 0522) Resp:  [15-17] 16 (01/24 0522) BP: (161-173)/(75-80) 173/80 (01/24 0522) SpO2:  [93 %-98 %] 93 % (01/24 0522) Last BM Date: 06/20/19  Intake/Output from previous day: 01/23 0701 - 01/24 0700 In: 2042.6 [I.V.:1733.5; IV Piggyback:309.1] Out: 150 [Emesis/NG output:150] Intake/Output this shift: No intake/output data recorded.  General appearance: alert and cooperative Resp: Unlabored GI: soft, non-tender; bowel sounds normal; no masses,  no organomegaly Pulses: 2+ and symmetric Skin: Skin color, texture, turgor normal. No rashes or lesions Neurologic: Grossly normal  Lab Results:  Recent Labs    06/20/19 0530 06/21/19 0530  WBC 9.5 6.7  HGB 11.3* 11.7*  HCT 35.3* 36.8  PLT 204 191   BMET Recent Labs    06/20/19 0530 06/20/19 0530 06/20/19 1823 06/21/19 0530  NA 139  --   --  141  K 3.3*   < > 3.5 3.8  CL 104  --   --  106  CO2 26  --   --  28  GLUCOSE 135*  --   --  121*  BUN 13  --   --  9  CREATININE 0.65  --   --  0.58  CALCIUM 8.5*  --   --  8.4*   < > = values in this interval not displayed.   PT/INR No results for input(s): LABPROT, INR in the last 72 hours. ABG No results for input(s): PHART, HCO3 in the last 72 hours.  Invalid input(s): PCO2, PO2  Studies/Results: DG Abd Portable 1V-Small Bowel Obstruction Protocol-initial, 8 hr delay  Result Date: 06/20/2019 CLINICAL DATA:  Small bowel protocol, 8 hour delayed film. EXAM: PORTABLE ABDOMEN - 1 VIEW COMPARISON:  Radiographs earlier this day. CT yesterday. FINDINGS: Possible but not definite enteric contrast in the terminal ileum and cecum. No other enteric  contrast in the colon. Previous prominent small bowel loop is no longer seen. The enteric tube is no longer seen. Cholecystectomy clips in the right upper quadrant. IMPRESSION: Possible but not definite enteric contrast in the terminal ileum and cecum. No other enteric contrast in the colon. Recommend 24 hour film. Previous prominent small bowel loop is no longer seen. Electronically Signed   By: Keith Rake M.D.   On: 06/20/2019 19:22   DG Abd Portable 1V  Result Date: 06/20/2019 CLINICAL DATA:  Abdominal pain EXAM: PORTABLE ABDOMEN - 1 VIEW COMPARISON:  Plain film of the abdomen dated 06/19/2019. FINDINGS: Overall bowel gas pattern is nonobstructive. Patulous loop of small bowel within the central abdomen to RIGHT upper quadrant, suspicious for small bowel wall thickening. No evidence of abnormal fluid collection or free intraperitoneal air. No evidence of renal or ureteral calculi. Cholecystectomy clips in RIGHT upper quadrant. Enteric tube projects to the level of the gastroesophageal junction. IMPRESSION: 1. Patulous loop of small bowel within the central abdomen extending to the RIGHT upper quadrant, suspicious for small bowel wall thickening/enteritis. 2. Enteric tube tip at the level of the gastroesophageal junction. Recommend advancing into the stomach. 3. No evidence of bowel obstruction. Electronically Signed   By: Franki Cabot M.D.   On: 06/20/2019 10:42    Anti-infectives: Anti-infectives (From admission, onward)  Start     Dose/Rate Route Frequency Ordered Stop   06/20/19 0600  cefTRIAXone (ROCEPHIN) 2 g in sodium chloride 0.9 % 100 mL IVPB     2 g 200 mL/hr over 30 Minutes Intravenous Every 24 hours 06/19/19 1659     06/19/19 1715  metroNIDAZOLE (FLAGYL) IVPB 500 mg     500 mg 100 mL/hr over 60 Minutes Intravenous Every 8 hours 06/19/19 1659     06/19/19 0500  cefTRIAXone (ROCEPHIN) 2 g in sodium chloride 0.9 % 100 mL IVPB     2 g 200 mL/hr over 30 Minutes Intravenous  Once  06/19/19 0456 06/19/19 0537   06/19/19 0500  metroNIDAZOLE (FLAGYL) IVPB 500 mg     500 mg 100 mL/hr over 60 Minutes Intravenous  Once 06/19/19 0456 06/19/19 0734      Assessment/Plan: Transient partial obstruction from possible terminal ileitis, now having bowel function Start clear liquid diet  LOS: 2 days    Carrie Watkins 06/21/2019

## 2019-06-22 LAB — BASIC METABOLIC PANEL
Anion gap: 10 (ref 5–15)
BUN: <5 mg/dL — ABNORMAL LOW (ref 8–23)
CO2: 26 mmol/L (ref 22–32)
Calcium: 8.6 mg/dL — ABNORMAL LOW (ref 8.9–10.3)
Chloride: 104 mmol/L (ref 98–111)
Creatinine, Ser: 0.54 mg/dL (ref 0.44–1.00)
GFR calc Af Amer: >60 mL/min (ref >60)
GFR calc non Af Amer: >60 mL/min (ref >60)
Glucose, Bld: 110 mg/dL — ABNORMAL HIGH (ref 70–99)
Potassium: 3.1 mmol/L — ABNORMAL LOW (ref 3.5–5.1)
Sodium: 140 mmol/L (ref 135–145)

## 2019-06-22 LAB — CBC
HCT: 35.4 % — ABNORMAL LOW (ref 36.0–46.0)
Hemoglobin: 11.1 g/dL — ABNORMAL LOW (ref 12.0–15.0)
MCH: 29.4 pg (ref 26.0–34.0)
MCHC: 31.4 g/dL (ref 30.0–36.0)
MCV: 93.9 fL (ref 80.0–100.0)
Platelets: 206 10*3/uL (ref 150–400)
RBC: 3.77 MIL/uL — ABNORMAL LOW (ref 3.87–5.11)
RDW: 12.4 % (ref 11.5–15.5)
WBC: 6.4 10*3/uL (ref 4.0–10.5)
nRBC: 0 % (ref 0.0–0.2)

## 2019-06-22 LAB — MAGNESIUM: Magnesium: 1.8 mg/dL (ref 1.7–2.4)

## 2019-06-22 LAB — GLUCOSE, CAPILLARY
Glucose-Capillary: 100 mg/dL — ABNORMAL HIGH (ref 70–99)
Glucose-Capillary: 102 mg/dL — ABNORMAL HIGH (ref 70–99)
Glucose-Capillary: 116 mg/dL — ABNORMAL HIGH (ref 70–99)
Glucose-Capillary: 124 mg/dL — ABNORMAL HIGH (ref 70–99)
Glucose-Capillary: 132 mg/dL — ABNORMAL HIGH (ref 70–99)
Glucose-Capillary: 99 mg/dL (ref 70–99)

## 2019-06-22 MED ORDER — AMOXICILLIN-POT CLAVULANATE 875-125 MG PO TABS
1.0000 | ORAL_TABLET | Freq: Two times a day (BID) | ORAL | Status: DC
  Administered 2019-06-22 – 2019-06-23 (×3): 1 via ORAL
  Filled 2019-06-22 (×3): qty 1

## 2019-06-22 MED ORDER — POTASSIUM CHLORIDE CRYS ER 20 MEQ PO TBCR
40.0000 meq | EXTENDED_RELEASE_TABLET | Freq: Two times a day (BID) | ORAL | Status: AC
  Administered 2019-06-22 (×2): 40 meq via ORAL
  Filled 2019-06-22 (×2): qty 2

## 2019-06-22 MED ORDER — IRBESARTAN 150 MG PO TABS
150.0000 mg | ORAL_TABLET | Freq: Every day | ORAL | Status: DC
  Administered 2019-06-22 – 2019-06-23 (×2): 150 mg via ORAL
  Filled 2019-06-22 (×2): qty 1

## 2019-06-22 MED ORDER — BUTALBITAL-APAP-CAFFEINE 50-325-40 MG PO TABS
2.0000 | ORAL_TABLET | Freq: Once | ORAL | Status: AC
  Administered 2019-06-22: 2 via ORAL
  Filled 2019-06-22: qty 2

## 2019-06-22 MED ORDER — DIPHENHYDRAMINE HCL 25 MG PO CAPS
25.0000 mg | ORAL_CAPSULE | Freq: Three times a day (TID) | ORAL | Status: DC | PRN
  Administered 2019-06-22: 25 mg via ORAL
  Filled 2019-06-22: qty 1

## 2019-06-22 MED ORDER — DIPHENHYDRAMINE HCL 50 MG/ML IJ SOLN
25.0000 mg | Freq: Once | INTRAMUSCULAR | Status: AC
  Administered 2019-06-22: 08:00:00 25 mg via INTRAVENOUS
  Filled 2019-06-22: qty 1

## 2019-06-22 NOTE — Progress Notes (Signed)
Carrie Watkins has c/o generalized itching and developed generalized rash. Notified [facility], Arby Barrette  of the above. Informed [place] that the patient believe to be the hydrAlazine. This is the only new med administered in which the patient has received for the first time. Update this patient allergies list and administered bendaryl. Will continue to monitor and assess this patient needs and concerns.

## 2019-06-22 NOTE — Progress Notes (Signed)
PROGRESS NOTE  Carrie Watkins:[id number] DOB: [birth year] DOA: 06/19/2019 PCP: [name], MD  HPI/Recap of past 24 hours: The patient is a 74 yr old woman with a past medical history significant for irritable bowel syndrome, Anemia, DM II, GERD, GI Bleed, hyperlipidemia, hypertension, hypothyroidism, anxiety, and depression, cholecystectomy, COVID-19 viral infection 05/15/19 treated at [facility].  Presented to [facility] yesterday evening after she developed periumbilical abdominal pain, nausea and vomiting.  CT of the abdomen and pelvis demonstrated dilated distal small bowel with inflammation at the terminal ileum with thickening of the bowel wall. An NGT was placed resulting in some relief of her symptoms, it accidentally came out on 06/20/19.   Continued to improve and on 06/21/19 was started on clear liquid diet.  Followed by general surgery, she completed 3 days of empiric IV abx Rocephin and flagyl.  Started on soft diet on 1/25 and switched to po Augmentin x 7 days per Gen surgery recommendations.  Hospital course complicated by intermittent headaches requiring treatment with headache cocktail and fiorecet.  To note: CT abd/pelvi w contrast done on 06/19/19 also showed a left adnexal cyst that demonstrated interval enlargement. Recommendation for outpatient pelvic ultrasound and evaluation by OB/Gyn.   06/22/19: Seen and examined.  Overnight had a reaction to hydralazine developed a rash on her face and chest and was given IV Benadryl with improvement.  Reports not feeling well this morning due to a headache.  Possibly contributed by uncontrolled hypertension.  Blood pressure not at goal.  Denies any nausea or abdominal pain.  Seen by general surgery, signed off.     Assessment/Plan: Active Problems:   Diabetes mellitus type 2, controlled, without complications (HCC)   Essential hypertension   Hypothyroidism   Obstruction of bowel (HCC)   Abnormal computed tomography  of cecum and terminal ileum   Abdominal pain   Nausea and vomiting   COVID-19 virus infection  Resolving small bowel obstruction on CT scan likely transient partial obstruction from possible terminal ileitis now having bowel function. NG tube placed in the ED to suction, accidentally came out on 06/20/2019. Reports 2 bowel movements on 06/21/2019 and positive flatus. Electrolytes stable. Tolerated clear diet, start a soft diet. DC IV fluid. Mobilize as tolerated with assistance and fall precautions.  Suspected terminal ileitis on CT Completed 3 days of Rocephin and IV Flagyl empirically on 06/22/2019 Started on oral Augmentin on 06/22/2019. Continue pain management as needed  Refractory hypokalemia Potassium 3.3>> 3.8>> 3.1 Repleted with oral KCl 40 mEq x 2 doses.  Hypothyroidism Continue home home Synthroid  Type 2 diabetes, well controlled Continue insulin sliding scale Continue to hold oral hypoglycemic Metformin Last hemoglobin A1c 6.3 on 06/19/2019.    Chronic anxiety Continue home medications  Intractable headache likely exacerbated by uncontrolled hypertension. Received Fioricet x1 dose  History of COVID-19 viral infection Positive COVID-19 test on 05/15/2019 Treated at [facility] Completed 21-day of quarantine.  Uncontrolled hypertension  blood pressure is not at goal BP improved after increasing doses of antihypertensive    Code Status: Full code  Family Communication: None at bedside  Disposition Plan: Patient is from home.  Anticipate discharge to home likely tomorrow 06/23/2019.  Barrier to discharge to tolerate a soft diet and control BP.  Consultants:  General surgery  Procedures:  NG tube placement 06/19/19--06/20/19  Antimicrobials:  Rocephin and Flagyl  DVT prophylaxis: Subcu Lovenox daily   Objective: Vitals:   06/21/19 0522 06/21/19 1432 06/21/19 2224 06/22/19 0503  BP: Marland Kitchen)  173/80 (!) 170/67 (!) 154/67 (!) 152/61  Pulse: 73 69 80 70  Resp:  16 16  16   Temp: 97.7 F (36.5 C) 97.7 F (36.5 C) 98.9 F (37.2 C) 98.9 F (37.2 C)  TempSrc: Oral Oral Oral Oral  SpO2: 93% 95% 94% 91%  Weight:      Height:        Intake/Output Summary (Last 24 hours) at 06/22/2019 1053 Last data filed at 06/21/2019 1340 Gross per 24 hour  Intake 320 ml  Output --  Net 320 ml   Filed Weights   06/19/19 0252 06/19/19 1610  Weight: 72.6 kg 74 kg    Exam:  . General: 74 y.o. year-old female pleasant well-developed well-nourished no acute distress.  Appears uncomfortable due to headache.   . Cardiovascular: Regular rate and rhythm no rubs or gallops. Marland Kitchen Respiratory: Clear consultation no wheezes no rales.   . Abdomen: No tenderness with palpation.  Bowel sounds present.  Musculoskeletal: No lower extremity edema bilaterally. Psychiatry: Mood is appropriate for condition and setting.  Data Reviewed: CBC: Recent Labs  Lab 06/19/19 0300 06/19/19 1712 06/20/19 0530 06/21/19 0530 06/22/19 0456  WBC 9.3 9.4 9.5 6.7 6.4  NEUTROABS 7.3 6.9  --   --   --   HGB 13.2 12.4 11.3* 11.7* 11.1*  HCT 41.7 38.6 35.3* 36.8 35.4*  MCV 93.3 92.6 93.6 94.4 93.9  PLT 223 197 204 191 299   Basic Metabolic Panel: Recent Labs  Lab 06/19/19 0300 06/20/19 0530 06/20/19 1823 06/21/19 0530 06/22/19 0456  NA 137 139  --  141 140  K 3.8 3.3* 3.5 3.8 3.1*  CL 106 104  --  106 104  CO2 21* 26  --  28 26  GLUCOSE 169* 135*  --  121* 110*  BUN 14 13  --  9 <5*  CREATININE 0.59 0.65  --  0.58 0.54  CALCIUM 9.2 8.5*  --  8.4* 8.6*  MG  --   --  2.5* 2.0 1.8   GFR: Estimated Creatinine Clearance: 61.7 mL/min (by C-G formula based on SCr of 0.54 mg/dL). Liver Function Tests: Recent Labs  Lab 06/19/19 0300  AST 25  ALT 18  ALKPHOS 97  BILITOT 1.0  PROT 7.8  ALBUMIN 4.3   Recent Labs  Lab 06/19/19 0300  LIPASE 19   No results for input(s): AMMONIA in the last 168 hours. Coagulation Profile: No results for input(s): INR, PROTIME in the last  168 hours. Cardiac Enzymes: No results for input(s): CKTOTAL, CKMB, CKMBINDEX, TROPONINI in the last 168 hours. BNP (last 3 results) No results for input(s): PROBNP in the last 8760 hours. HbA1C: Recent Labs    06/19/19 1746  HGBA1C 6.3*   CBG: Recent Labs  Lab 06/21/19 1556 06/21/19 2031 06/21/19 2348 06/22/19 0340 06/22/19 0752  GLUCAP 106* 109* 107* 99 100*   Lipid Profile: No results for input(s): CHOL, HDL, LDLCALC, TRIG, CHOLHDL, LDLDIRECT in the last 72 hours. Thyroid Function Tests: No results for input(s): TSH, T4TOTAL, FREET4, T3FREE, THYROIDAB in the last 72 hours. Anemia Panel: No results for input(s): VITAMINB12, FOLATE, FERRITIN, TIBC, IRON, RETICCTPCT in the last 72 hours. Urine analysis:    Component Value Date/Time   COLORURINE YELLOW 06/19/2019 0352   APPEARANCEUR CLEAR 06/19/2019 0352   LABSPEC >1.030 (H) 06/19/2019 0352   PHURINE 6.0 06/19/2019 0352   GLUCOSEU NEGATIVE 06/19/2019 0352   HGBUR NEGATIVE 06/19/2019 0352   BILIRUBINUR NEGATIVE 06/19/2019 0352   KETONESUR 15 (A)  06/19/2019 0352   PROTEINUR 30 (A) 06/19/2019 0352   UROBILINOGEN 0.2 12/18/2013 0936   NITRITE NEGATIVE 06/19/2019 0352   LEUKOCYTESUR SMALL (A) 06/19/2019 0352   Sepsis Labs: @LABRCNTIP (procalcitonin:4,lacticidven:4)  ) Recent Results (from the past 240 hour(s))  Urine Culture     Status: None   Collection Time: 06/19/19  3:52 AM   Specimen: Urine, Random  Result Value Ref Range Status   Specimen Description   Final    URINE, RANDOM Performed at [facility], [address] [place], [place] [address]    Special Requests   Final    NONE Performed at [facility], [address] [place], [place] [address]    Culture   Final    NO GROWTH Performed at [facility], [address] [place], [place] [address]    Report Status 06/20/2019 FINAL  Final      Studies: DG Abd Portable 1V-Small Bowel Obstruction Protocol-24 hr  delay  Result Date: 06/21/2019 CLINICAL DATA:  History of small-bowel obstruction. EXAM: PORTABLE ABDOMEN - 1 VIEW COMPARISON:  Single-view of the abdomen 06/20/2019 06/19/2019. FINDINGS: The bowel gas pattern is normal. Single gas-filled but nondilated loop of small bowel noted. No radio-opaque calculi or other significant radiographic abnormality are seen. IMPRESSION: Negative exam. Electronically Signed   By: Inge Rise M.D.   On: 06/21/2019 11:25    Scheduled Meds: . ALPRAZolam  0.5 mg Oral BID  . amoxicillin-clavulanate  1 tablet Oral Q12H  . cholecalciferol  1,000 Units Oral Daily  . enoxaparin (LOVENOX) injection  40 mg Subcutaneous Q24H  . insulin aspart  0-6 Units Subcutaneous Q4H  . irbesartan  150 mg Oral Daily  . levothyroxine  112 mcg Oral QAC breakfast  . LORazepam  0.5 mg Oral Q8H  . pantoprazole  40 mg Oral Daily  . sertraline  50 mg Oral Daily  . simvastatin  10 mg Oral Daily    Continuous Infusions: . sodium chloride 250 mL (06/22/19 0835)  . dextrose 5% lactated ringers 50 mL/hr at 06/22/19 1052     LOS: 3 days     Kayleen Memos, MD [facility] Pager [phone]  If 7PM-7AM, please contact night-coverage [email or web address] Password [facility] 06/22/2019, 10:53 AM

## 2019-06-22 NOTE — Care Management Important Message (Signed)
Important Message  Patient Details IM Letter given to Marney Doctor RN Case Manager to present to the Patient Name: SYLIVA MEE MRN: [id number] Date of Birth: Jun 25, 1945   Medicare Important Message Given:  Yes     Kerin Salen 06/22/2019, 9:15 AM

## 2019-06-22 NOTE — Final Consult Note (Signed)
Consultant Final Sign-Off Note    Assessment/Final recommendations  Carrie Watkins is a 74 y.o. female followed by me for terminal ileitis. Patient is tolerating a diet and having bowel function. Abdominal pain resolved.    Wound care (if applicable): N/A   Diet at discharge: soft diet    Activity at discharge: per primary team   Follow-up appointment: PCP   Pending results:  Unresulted Labs (From admission, onward)    Start     Ordered   06/21/19 0500  CBC  Daily,   R     06/20/19 1356   06/21/19 9476  Basic metabolic panel  Daily,   R     06/20/19 1356   06/21/19 0500  Magnesium  Daily,   R     06/20/19 1356           Medication recommendations: recommend d/c on 1 week abx, can do PO augmentin or cefdinir/flagyl    Other recommendations: stool softeners/miralax prn     Thank you for allowing [place] to participate in the care of your patient!  Please consult [place] again if you have further needs for your patient.  Claiborne Billings Rayburn 06/22/2019 8:14 AM    Subjective   Patient reports headache this AM. Denies abdominal pain or nausea. Having bowel function.   Objective  Vital signs in last 24 hours: Temp:  [97.7 F (36.5 C)-98.9 F (37.2 C)] 98.9 F (37.2 C) (01/25 0503) Pulse Rate:  [69-80] 70 (01/25 0503) Resp:  [16] 16 (01/25 0503) BP: (152-170)/(61-67) 152/61 (01/25 0503) SpO2:  [91 %-95 %] 91 % (01/25 0503)  Physical Exam General appearance: alert, NAD Resp: Unlabored GI: soft, non-tender; bowel sounds normal; no masses,  no organomegaly Pulses: 2+ and symmetric Skin: Skin color, texture, turgor normal. No rashes or lesions Neurologic: Grossly normal   Pertinent labs and Studies: Recent Labs    06/20/19 0530 06/21/19 0530 06/22/19 0456  WBC 9.5 6.7 6.4  HGB 11.3* 11.7* 11.1*  HCT 35.3* 36.8 35.4*   BMET Recent Labs    06/21/19 0530 06/22/19 0456  NA 141 140  K 3.8 3.1*  CL 106 104  CO2 28 26  GLUCOSE 121* 110*  BUN 9 <5*  CREATININE 0.58  0.54  CALCIUM 8.4* 8.6*   No results for input(s): LABURIN in the last 72 hours. Results for orders placed or performed during the hospital encounter of 06/19/19  Urine Culture     Status: None   Collection Time: 06/19/19  3:52 AM   Specimen: Urine, Random  Result Value Ref Range Status   Specimen Description   Final    URINE, RANDOM Performed at [facility], [address] [place], [place] [address]    Special Requests   Final    NONE Performed at [facility], [address] [place], [place] [address]    Culture   Final    NO GROWTH Performed at [facility], [place] [address] [place], [place] [address]    Report Status 06/20/2019 FINAL  Final    Imaging: DG Abd Portable 1V-Small Bowel Obstruction Protocol-24 hr delay  Result Date: 06/21/2019 CLINICAL DATA:  History of small-bowel obstruction. EXAM: PORTABLE ABDOMEN - 1 VIEW COMPARISON:  Single-view of the abdomen 06/20/2019 06/19/2019. FINDINGS: The bowel gas pattern is normal. Single gas-filled but nondilated loop of small bowel noted. No radio-opaque calculi or other significant radiographic abnormality are seen. IMPRESSION: Negative exam. Electronically Signed   By:  Inge Rise M.D.   On: 06/21/2019 11:25

## 2019-06-23 LAB — CBC
HCT: 37.2 % (ref 36.0–46.0)
Hemoglobin: 11.7 g/dL — ABNORMAL LOW (ref 12.0–15.0)
MCH: 29.5 pg (ref 26.0–34.0)
MCHC: 31.5 g/dL (ref 30.0–36.0)
MCV: 93.9 fL (ref 80.0–100.0)
Platelets: 212 10*3/uL (ref 150–400)
RBC: 3.96 MIL/uL (ref 3.87–5.11)
RDW: 12.4 % (ref 11.5–15.5)
WBC: 5.9 10*3/uL (ref 4.0–10.5)
nRBC: 0 % (ref 0.0–0.2)

## 2019-06-23 LAB — GLUCOSE, CAPILLARY
Glucose-Capillary: 96 mg/dL (ref 70–99)
Glucose-Capillary: 95 mg/dL (ref 70–99)
Glucose-Capillary: 157 mg/dL — ABNORMAL HIGH (ref 70–99)

## 2019-06-23 LAB — POTASSIUM: Potassium: 4.2 mmol/L (ref 3.5–5.1)

## 2019-06-23 LAB — MAGNESIUM: Magnesium: 1.7 mg/dL (ref 1.7–2.4)

## 2019-06-23 MED ORDER — AMOXICILLIN-POT CLAVULANATE 875-125 MG PO TABS: 1.0000 | ORAL_TABLET | Freq: Two times a day (BID) | ORAL | 0 refills | Status: AC

## 2019-06-23 MED ORDER — MAGNESIUM SULFATE 2 GM/50ML IV SOLN
2.0000 g | Freq: Once | INTRAVENOUS | Status: AC
  Administered 2019-06-23: 11:00:00 2 g via INTRAVENOUS
  Filled 2019-06-23: qty 50

## 2019-06-23 NOTE — Discharge Summary (Signed)
Discharge Summary  Carrie Watkins EXB:[id number] DOB: 31-Aug-1945  PCP: [name], MD  Admit date: 06/19/2019 Discharge date: 06/23/2019  Time spent: 35 minutes  Recommendations for Outpatient Follow-up:  1. Follow-up with general surgery 2. Follow-up with your primary care provider 3. Please obtain OB/GYN referral from your PCP 4. Take your medications as prescribed  Discharge Diagnoses:  Active Hospital Problems   Diagnosis Date Noted  . COVID-19 virus infection 06/20/2019  . Abnormal computed tomography of cecum and terminal ileum 06/19/2019  . Abdominal pain 06/19/2019  . Nausea and vomiting 06/19/2019  . Obstruction of bowel ([place]) 06/19/2019  . Diabetes mellitus type 2, controlled, without complications ([place]) 03/24/2535  . Essential hypertension 01/13/2014  . Hypothyroidism 01/13/2014    Resolved Hospital Problems  No resolved problems to display.    Discharge Condition: Stable  Diet recommendation: Heart healthy carb modified diet.  Vitals:   06/22/19 2057 06/23/19 0642  BP: (!) 186/91 (!) 152/67  Pulse: 76 60  Resp: 17 16  Temp: 97.7 F (36.5 C) 97.8 F (36.6 C)  SpO2: 97% 94%    History of present illness:  The patient is a pleasant 74 yr old woman with a past medical history significant for irritable bowel syndrome, Anemia, DM II, GERD, GI Bleed, hyperlipidemia, hypertension, hypothyroidism, anxiety, and depression, cholecystectomy, COVID-19 viral infection 05/15/19 treated at [facility].  Presented to [place] evening of 06/18/19 after she developed periumbilical abdominal pain, nausea and vomiting.  CT of the abdomen and pelvis with contrast done on 06/19/2019 demonstrated dilated distal small bowel with inflammation at the terminal ileum with thickening of the bowel wall. An NGT was placed in the ED resulting in some relief of her symptoms.  During hospitalization it accidentally came out on 06/20/19.     Her symptomatology improved and on  06/21/19 was started on clear liquid diet.    Seen in house by general surgery, she completed 3 days of empiric IV abx Rocephin and flagyl.    She was started on soft diet on 06/22/19 and switched to po Augmentin x 7 days per Gen surgery recommendations.  Hospital course complicated by intermittent headaches requiring treatment with IV headache cocktail (IV Reglan, IV Benadryl, and IV Toradol) and po fiorecet.  To note: CT abd/pelvi w contrast done on 06/19/19 also showed a left adnexal cyst that demonstrated interval enlargement. Recommendation for outpatient pelvic ultrasound and evaluation by OB/Gyn.   06/23/19: Seen and examined.  No acute events overnight.  No new complaints.  No reported headache at a time of this visit.  Vital signs and labs reviewed and are stable.  On the day of discharge, the patient was hemodynamically stable.  She will need to follow-up with general surgery and her primary care provider posthospitalization.  She will also need to obtain referral to GYN from her primary care provider.   Hospital Course:  Active Problems:   Diabetes mellitus type 2, controlled, without complications (HCC)   Essential hypertension   Hypothyroidism   Obstruction of bowel (HCC)   Abnormal computed tomography of cecum and terminal ileum   Abdominal pain   Nausea and vomiting   COVID-19 virus infection  Resolved small bowel obstruction, initially seen on CT scan, likely transient partial obstruction from possible terminal ileitis now having bowel function. NG tube placed in the ED to suction 06/19/19, accidentally came out on 06/20/2019. Reports bowel movements and flatus. Tolerating a soft diet Electrolytes reviewed and are stable.  Suspected terminal ileitis  on CT Completed 3 days of Rocephin and IV Flagyl empirically on 06/22/2019 Started on oral Augmentin on 06/22/2019 x7 days at general surgery's recommendation. Follow-up with general surgery.  Resolved post repletion:  Refractory hypokalemia Potassium 3.3>> 3.8>> 3.1>> 4.2 on 06/23/2019.  Hypomagnesemia Mg2+ 1.7 Repleted with IV magnesium 2 g once  Hypothyroidism Continue home home Synthroid Follow-up with your PCP  Type 2 diabetes, well controlled Continue insulin sliding scale Last hemoglobin A1c 6.3 on 06/19/2019 Resume home Metformin.   Chronic anxiety Continue home medications  Resolved Intractable headache likely exacerbated by uncontrolled hypertension. Received IV headache cocktail (IV Reglan, IV Benadryl, and IV Toradol)  Received Fioricet x1 dose  History of COVID-19 viral infection Positive COVID-19 test on 05/15/2019 Treated at [facility] Completed 21-day of quarantine.  Resolving Uncontrolled hypertension Restart home medication Follow up with your PCP.    Code Status: Full code   Consultants:  General surgery  Procedures:  NG tube placement 06/19/19--06/20/19  Antimicrobials:  Completed 3 days of Rocephin and Flagyl  Augmentin x 7 days   Discharge Exam: BP (!) 152/67 (BP Location: Right Arm)   Pulse 60   Temp 97.8 F (36.6 C) (Oral)   Resp 16   Ht 5' 4"  (1.626 m)   Wt 74 kg   SpO2 94%   BMI 28.01 kg/m  . General: 74 y.o. year-old female well developed well nourished in no acute distress.  Alert and oriented x3. . Cardiovascular: Regular rate and rhythm with no rubs or gallops.  No thyromegaly or JVD noted.   Marland Kitchen Respiratory: Clear to auscultation with no wheezes or rales. Good inspiratory effort. . Abdomen: Soft nontender nondistended with normal bowel sounds x4 quadrants. . Musculoskeletal: No lower extremity edema. 2/4 pulses in all 4 extremities. Marland Kitchen Psychiatry: Mood is appropriate for condition and setting  Discharge Instructions You were cared for by a hospitalist during your hospital stay. If you have any questions about your discharge medications or the care you received while you were in the hospital after you are discharged, you can call the  unit and asked to speak with the hospitalist on call if the hospitalist that took care of you is not available. Once you are discharged, your primary care physician will handle any further medical issues. Please note that NO REFILLS for any discharge medications will be authorized once you are discharged, as it is imperative that you return to your primary care physician (or establish a relationship with a primary care physician if you do not have one) for your aftercare needs so that they can reassess your need for medications and monitor your lab values.   Allergies as of 06/23/2019      Reactions   Ciprofloxacin Other (See Comments)   Was told could not take this per  Urologist Was told could not take this per  Urologist   Prozac [fluoxetine Hcl]    Hydralazine Itching, Rash      Medication List    TAKE these medications   ALPRAZolam 0.5 MG tablet Commonly known as: XANAX Take 0.5 mg by mouth 2 (two) times daily.   amoxicillin-clavulanate 875-125 MG tablet Commonly known as: AUGMENTIN Take 1 tablet by mouth every 12 (twelve) hours for 6 days.   cholecalciferol 25 MCG (1000 UNIT) tablet Commonly known as: VITAMIN D3 Take 1,000 Units by mouth daily.   dicyclomine 10 MG capsule Commonly known as: BENTYL Take 1 capsule (10 mg total) by mouth 3 (three) times daily as needed for spasms.  levothyroxine 112 MCG tablet Commonly known as: SYNTHROID Take 112 mcg by mouth every morning.   LORazepam 0.5 MG tablet Commonly known as: ATIVAN Take 0.5 mg by mouth every 8 (eight) hours.   metFORMIN 500 MG 24 hr tablet Commonly known as: GLUCOPHAGE-XR Take 1,000 mg by mouth daily.   omeprazole 40 MG capsule Commonly known as: PRILOSEC Take 40 mg by mouth daily.   sertraline 50 MG tablet Commonly known as: ZOLOFT Take 50 mg by mouth daily.   simvastatin 10 MG tablet Commonly known as: ZOCOR Take 10 mg by mouth daily.   valsartan 160 MG tablet Commonly known as: DIOVAN Take 160  mg by mouth daily.      Allergies  Allergen Reactions  . Ciprofloxacin Other (See Comments)    Was told could not take this per  Urologist Was told could not take this per  Urologist   . Prozac [Fluoxetine Hcl]   . Hydralazine Itching and Rash   Follow-up Information    [name], MD. Call in 1 day(s).   Specialty: Family Medicine Why: Please call for a post hospital follow-up appointment. Contact information: [address] [place] [address] [phone]        Clovis Riley, MD. Call in 1 day(s).   Specialty: General Surgery Why: Please call for a post hospital follow-up appointment. Contact information: [address] [place] [address] [phone]            The results of significant diagnostics from this hospitalization (including imaging, microbiology, ancillary and laboratory) are listed below for reference.    Significant Diagnostic Studies: CT ABDOMEN PELVIS W CONTRAST  Result Date: 06/19/2019 CLINICAL DATA:  Abdominal pain. Nausea and vomiting. EXAM: CT ABDOMEN AND PELVIS WITH CONTRAST TECHNIQUE: Multidetector CT imaging of the abdomen and pelvis was performed using the standard protocol following bolus administration of intravenous contrast. CONTRAST:  11m OMNIPAQUE IOHEXOL 300 MG/ML  SOLN COMPARISON:  CT of the abdomen and pelvis without contrast 10/05/2016. CT abdomen pelvis without and with contrast 08/24/2015 FINDINGS: Lower chest: Linear atelectasis or scarring is present in the left lower lobe. The heart size is normal. No significant pleural or pericardial effusion is present. Hepatobiliary: Multiple bilateral static cysts are again seen. Cholecystectomy is noted. The common bile duct is within normal limits. Pancreas: Unremarkable. No pancreatic ductal dilatation or surrounding inflammatory changes. Spleen: Normal in size without focal abnormality. Adrenals/Urinary Tract: A right adrenal adenoma is  stable. The left adrenal gland is normal. Kidneys and ureters are within limits. The urinary bladder is mostly collapsed. Stomach/Bowel: Stomach and duodenum are within limits. Distal loops of small bowel progressively dilated to 3.7 cm. There is marked wall thickening in the terminal ileum. No mass lesion is present. The appendix is within normal limits. The ascending colon is within normal limits. Diverticula are present throughout the colon beginning in the transverse colon and most prominent in the sigmoid colon. No focal inflammatory changes present about the colon. Vascular/Lymphatic: Atherosclerotic calcifications are present the aorta without aneurysm. No significant adenopathy is present. Reproductive: A progressively enlarging left adnexal cystic lesion now measures 4.2 x 3.6 x 2.6 cm. Uterus and right adnexa are within normal limits. Other: Free fluid is present within the anatomic pelvis. No free air is present. No significant ventral hernia is present. Musculoskeletal: Degenerative facet changes are present in the lower lumbar spine. No focal lytic or blastic lesions are present. Bony pelvis is within normal limits. The hips are  located and within normal limits. IMPRESSION: 1. Distal small bowel obstruction secondary inflammatory changes in the terminal ileum raising concern for inflammatory bowel disease. Focal infection is also considered. 2. Free fluid within the anatomic pelvis is likely reactive. 3. Progressive enlargement of left adnexal cystic lesion now measuring 4.2 x 3.6 x 2.6 cm. Given the interval growth of this lesion and the patient's age, follow-up gyn referral and ultrasound is recommended. 4. Stable hepatic cysts. 5. Stable right adrenal adenoma. Electronically Signed   By: San Morelle M.D.   On: 06/19/2019 04:34   DG Abd Portable 1V-Small Bowel Obstruction Protocol-24 hr delay  Result Date: 06/21/2019 CLINICAL DATA:  History of small-bowel obstruction. EXAM: PORTABLE  ABDOMEN - 1 VIEW COMPARISON:  Single-view of the abdomen 06/20/2019 06/19/2019. FINDINGS: The bowel gas pattern is normal. Single gas-filled but nondilated loop of small bowel noted. No radio-opaque calculi or other significant radiographic abnormality are seen. IMPRESSION: Negative exam. Electronically Signed   By: Inge Rise M.D.   On: 06/21/2019 11:25   DG Abd Portable 1V-Small Bowel Obstruction Protocol-initial, 8 hr delay  Result Date: 06/20/2019 CLINICAL DATA:  Small bowel protocol, 8 hour delayed film. EXAM: PORTABLE ABDOMEN - 1 VIEW COMPARISON:  Radiographs earlier this day. CT yesterday. FINDINGS: Possible but not definite enteric contrast in the terminal ileum and cecum. No other enteric contrast in the colon. Previous prominent small bowel loop is no longer seen. The enteric tube is no longer seen. Cholecystectomy clips in the right upper quadrant. IMPRESSION: Possible but not definite enteric contrast in the terminal ileum and cecum. No other enteric contrast in the colon. Recommend 24 hour film. Previous prominent small bowel loop is no longer seen. Electronically Signed   By: Keith Rake M.D.   On: 06/20/2019 19:22   DG Abd Portable 1V  Result Date: 06/20/2019 CLINICAL DATA:  Abdominal pain EXAM: PORTABLE ABDOMEN - 1 VIEW COMPARISON:  Plain film of the abdomen dated 06/19/2019. FINDINGS: Overall bowel gas pattern is nonobstructive. Patulous loop of small bowel within the central abdomen to RIGHT upper quadrant, suspicious for small bowel wall thickening. No evidence of abnormal fluid collection or free intraperitoneal air. No evidence of renal or ureteral calculi. Cholecystectomy clips in RIGHT upper quadrant. Enteric tube projects to the level of the gastroesophageal junction. IMPRESSION: 1. Patulous loop of small bowel within the central abdomen extending to the RIGHT upper quadrant, suspicious for small bowel wall thickening/enteritis. 2. Enteric tube tip at the level of the  gastroesophageal junction. Recommend advancing into the stomach. 3. No evidence of bowel obstruction. Electronically Signed   By: Franki Cabot M.D.   On: 06/20/2019 10:42   DG Abd Portable 1 View  Result Date: 06/19/2019 CLINICAL DATA:  NG tube placement. EXAM: PORTABLE ABDOMEN - 1 VIEW COMPARISON:  CT 06/19/2019.  Chest x-ray 11/08/2009. FINDINGS: NG tube noted with tip and side hole in the stomach. No bowel distention or free air noted. Calcified nodule over right mid lung consistent with granuloma again noted. IMPRESSION: NG tube noted with tip and side hole in the stomach. No bowel distention or free air noted. Reference is made to prior CT report of 06/19/2019 for discussion of small-bowel obstruction. Electronically Signed   By: Marcello Moores  Register   On: 06/19/2019 05:41    Microbiology: Recent Results (from the past 240 hour(s))  Urine Culture     Status: None   Collection Time: 06/19/19  3:52 AM   Specimen: Urine, Random  Result Value Ref Range  Status   Specimen Description   Final    URINE, RANDOM Performed at [facility], [address] [place], [place] [address]    Special Requests   Final    NONE Performed at [facility], [address] [place], [place] [address]    Culture   Final    NO GROWTH Performed at [facility], [place] [address] [place],  [address]    Report Status 06/20/2019 FINAL  Final     Labs: Basic Metabolic Panel: Recent Labs  Lab 06/19/19 0300 06/19/19 0300 06/20/19 0530 06/20/19 1823 06/21/19 0530 06/22/19 0456 06/23/19 0437  NA 137  --  139  --  141 140  --   K 3.8   < > 3.3* 3.5 3.8 3.1* 4.2  CL 106  --  104  --  106 104  --   CO2 21*  --  26  --  28 26  --   GLUCOSE 169*  --  135*  --  121* 110*  --   BUN 14  --  13  --  9 <5*  --   CREATININE 0.59  --  0.65  --  0.58 0.54  --   CALCIUM 9.2  --  8.5*  --  8.4* 8.6*  --   MG  --   --   --  2.5* 2.0 1.8 1.7   < > = values in this interval not displayed.    Liver Function Tests: Recent Labs  Lab 06/19/19 0300  AST 25  ALT 18  ALKPHOS 97  BILITOT 1.0  PROT 7.8  ALBUMIN 4.3   Recent Labs  Lab 06/19/19 0300  LIPASE 19   No results for input(s): AMMONIA in the last 168 hours. CBC: Recent Labs  Lab 06/19/19 0300 06/19/19 0300 06/19/19 1712 06/20/19 0530 06/21/19 0530 06/22/19 0456 06/23/19 0437  WBC 9.3   < > 9.4 9.5 6.7 6.4 5.9  NEUTROABS 7.3  --  6.9  --   --   --   --   HGB 13.2   < > 12.4 11.3* 11.7* 11.1* 11.7*  HCT 41.7   < > 38.6 35.3* 36.8 35.4* 37.2  MCV 93.3   < > 92.6 93.6 94.4 93.9 93.9  PLT 223   < > 197 204 191 206 212   < > = values in this interval not displayed.   Cardiac Enzymes: No results for input(s): CKTOTAL, CKMB, CKMBINDEX, TROPONINI in the last 168 hours. BNP: BNP (last 3 results) No results for input(s): BNP in the last 8760 hours.  ProBNP (last 3 results) No results for input(s): PROBNP in the last 8760 hours.  CBG: Recent Labs  Lab 06/22/19 1601 06/22/19 1957 06/22/19 2337 06/23/19 0418 06/23/19 0758  GLUCAP 102* 116* 132* 96 95       Signed:  Kayleen Memos, MD [facility] 06/23/2019, 9:40 AM

## 2019-07-27 ENCOUNTER — Inpatient Hospital Stay (HOSPITAL_COMMUNITY)
Admission: EM | Admit: 2019-07-27 | Discharge: 2019-08-02 | DRG: 387 | Disposition: A | Discharge status: Home / Self Care | Payer: Medicare Other | Attending: Internal Medicine | Admitting: Internal Medicine

## 2019-07-27 ENCOUNTER — Encounter (HOSPITAL_COMMUNITY): Payer: Self-pay | Admitting: Emergency Medicine

## 2019-07-27 ENCOUNTER — Other Ambulatory Visit: Payer: Self-pay

## 2019-07-27 DIAGNOSIS — K50012 Crohn's disease of small intestine with intestinal obstruction: Secondary | ICD-10-CM | POA: Diagnosis not present

## 2019-07-27 DIAGNOSIS — Z888 Allergy status to other drugs, medicaments and biological substances status: Secondary | ICD-10-CM

## 2019-07-27 DIAGNOSIS — Z881 Allergy status to other antibiotic agents status: Secondary | ICD-10-CM

## 2019-07-27 DIAGNOSIS — F419 Anxiety disorder, unspecified: Secondary | ICD-10-CM | POA: Diagnosis present

## 2019-07-27 DIAGNOSIS — R109 Unspecified abdominal pain: Secondary | ICD-10-CM

## 2019-07-27 DIAGNOSIS — Z79890 Hormone replacement therapy (postmenopausal): Secondary | ICD-10-CM

## 2019-07-27 DIAGNOSIS — R110 Nausea: Secondary | ICD-10-CM | POA: Diagnosis not present

## 2019-07-27 DIAGNOSIS — Z8616 Personal history of COVID-19: Secondary | ICD-10-CM

## 2019-07-27 DIAGNOSIS — E785 Hyperlipidemia, unspecified: Secondary | ICD-10-CM | POA: Diagnosis present

## 2019-07-27 DIAGNOSIS — D126 Benign neoplasm of colon, unspecified: Secondary | ICD-10-CM

## 2019-07-27 DIAGNOSIS — Z79899 Other long term (current) drug therapy: Secondary | ICD-10-CM

## 2019-07-27 DIAGNOSIS — E119 Type 2 diabetes mellitus without complications: Secondary | ICD-10-CM | POA: Diagnosis present

## 2019-07-27 DIAGNOSIS — R8281 Pyuria: Secondary | ICD-10-CM | POA: Diagnosis present

## 2019-07-27 DIAGNOSIS — E039 Hypothyroidism, unspecified: Secondary | ICD-10-CM | POA: Diagnosis present

## 2019-07-27 DIAGNOSIS — K219 Gastro-esophageal reflux disease without esophagitis: Secondary | ICD-10-CM | POA: Diagnosis present

## 2019-07-27 DIAGNOSIS — E7800 Pure hypercholesterolemia, unspecified: Secondary | ICD-10-CM | POA: Diagnosis present

## 2019-07-27 DIAGNOSIS — Z7984 Long term (current) use of oral hypoglycemic drugs: Secondary | ICD-10-CM

## 2019-07-27 DIAGNOSIS — E876 Hypokalemia: Secondary | ICD-10-CM | POA: Diagnosis not present

## 2019-07-27 DIAGNOSIS — I10 Essential (primary) hypertension: Secondary | ICD-10-CM | POA: Diagnosis present

## 2019-07-27 DIAGNOSIS — K56600 Partial intestinal obstruction, unspecified as to cause: Secondary | ICD-10-CM | POA: Diagnosis present

## 2019-07-27 DIAGNOSIS — Z20822 Contact with and (suspected) exposure to covid-19: Secondary | ICD-10-CM | POA: Diagnosis present

## 2019-07-27 HISTORY — DX: Benign neoplasm of colon, unspecified: D12.6

## 2019-07-27 LAB — URINALYSIS, ROUTINE W REFLEX MICROSCOPIC
Bilirubin Urine: NEGATIVE
Glucose, UA: NEGATIVE mg/dL
Hgb urine dipstick: NEGATIVE
Ketones, ur: NEGATIVE mg/dL
Nitrite: NEGATIVE
Protein, ur: NEGATIVE mg/dL
Specific Gravity, Urine: 1.028 (ref 1.005–1.030)
pH: 5 (ref 5.0–8.0)

## 2019-07-27 MED ORDER — SODIUM CHLORIDE 0.9 % IV BOLUS
500.0000 mL | Freq: Once | INTRAVENOUS | Status: AC
  Administered 2019-07-27: 500 mL via INTRAVENOUS

## 2019-07-27 MED ORDER — ONDANSETRON HCL 4 MG/2ML IJ SOLN
4.0000 mg | Freq: Once | INTRAMUSCULAR | Status: AC
  Administered 2019-07-27: 4 mg via INTRAVENOUS
  Filled 2019-07-27: qty 2

## 2019-07-27 NOTE — ED Provider Notes (Signed)
[facility] DEPT Provider Note   CSN: [id number] Arrival date & time: 07/27/19  2055     History Chief Complaint  Patient presents with  . Abdominal Pain    Carrie Watkins is a 74 y.o. female.  HPI She presents for evaluation abdominal pain similar to prior when she had small bowel obstruction with terminal ileitis.  She describes onset of lower abdominal pain, bilateral, today with nausea but no vomiting.  She denies fever, chills, cough, shortness of breath, vomiting, back pain, dysuria, urinary frequency or difficulty walking.  Hospitalized in January, follow-up with GI, CAT scan 07/03/2019 and office appointment 07/21/2019.  No additional interventions made at follow-up with GI.  No known sick contacts.  There are no other known modifying factors.    Past Medical History:  Diagnosis Date  . Anemia   . Diabetes mellitus without complication ([place])   . GERD (gastroesophageal reflux disease)   . GI bleed   . High cholesterol   . Hypertension   . Thyroid disease     Patient Active Problem List   Diagnosis Date Noted  . COVID-19 virus infection 06/20/2019  . Small bowel obstruction, partial ([place]) 06/19/2019  . Obstruction of bowel ([place]) 06/19/2019  . Abnormal computed tomography of cecum and terminal ileum 06/19/2019  . Abdominal pain 06/19/2019  . Nausea and vomiting 06/19/2019  . Mixed hyperlipidemia 02/04/2015  . Vitamin D deficiency 04/01/2014  . Diabetes mellitus type 2, controlled, without complications ([place]) 60/73/7106  . Essential hypertension 01/13/2014  . Hypothyroidism 01/13/2014    History reviewed. No pertinent surgical history.   OB History   No obstetric history on file.     No family history on file.  Social History   Tobacco Use  . Smoking status: Never Smoker  . Smokeless tobacco: Never Used  Substance Use Topics  . Alcohol use: No  . Drug use: Not on file    Home Medications Prior to Admission medications    Medication Sig Start Date End Date Taking? Authorizing Provider  acetaminophen (TYLENOL) 325 MG tablet Take 650 mg by mouth every 6 (six) hours as needed for mild pain or headache.   Yes [provider]  ALPRAZolam Duanne Moron) 0.5 MG tablet Take 0.5 mg by mouth 2 (two) times daily. 04/29/19  Yes [provider]  Ascorbic Acid (VITAMIN C PO) Take 1 tablet by mouth daily.   Yes [provider]  cholecalciferol (VITAMIN D3) 25 MCG (1000 UNIT) tablet Take 1,000 Units by mouth daily.   Yes [provider]  dicyclomine (BENTYL) 10 MG capsule Take 1 capsule (10 mg total) by mouth 3 (three) times daily as needed for spasms. 03/03/17  Yes Charlesetta Shanks, MD  levothyroxine (SYNTHROID) 112 MCG tablet Take 112 mcg by mouth every morning. 04/27/19  Yes [provider]  LORazepam (ATIVAN) 0.5 MG tablet Take 0.5 mg by mouth every 8 (eight) hours as needed for anxiety.    Yes [provider]  metFORMIN (GLUCOPHAGE-XR) 500 MG 24 hr tablet Take 1,000 mg by mouth daily. 04/06/19  Yes [provider]  Multiple Vitamins-Minerals (ZINC PO) Take 1 tablet by mouth daily.   Yes [provider]  nortriptyline (PAMELOR) 10 MG capsule Take 10 mg by mouth at bedtime. 07/10/19  Yes [provider]  omeprazole (PRILOSEC) 40 MG capsule Take 40 mg by mouth daily.   Yes [provider]  promethazine (PHENERGAN) 12.5 MG tablet Take 12.5 mg by mouth every 6 (six)  hours as needed for nausea/vomiting. 06/26/19  Yes [provider]  sertraline (ZOLOFT) 50 MG tablet Take 50 mg by mouth daily. 04/24/19  Yes [provider]  simvastatin (ZOCOR) 10 MG tablet Take 10 mg by mouth daily.   Yes [provider]  valsartan (DIOVAN) 160 MG tablet Take 160 mg by mouth daily. 03/23/19  Yes [provider]  meclizine (ANTIVERT) 25 MG tablet Take 25 mg by mouth 2 (two) times daily as needed for dizziness.  06/26/19   [provider]    Allergies    Ciprofloxacin, Prozac [fluoxetine hcl], and Hydralazine  Review of Systems   Review of Systems  All other systems reviewed and are negative.   Physical Exam Updated Vital Signs BP (!) 170/83   Pulse 78   Temp 98 F (36.7 C) (Oral)   Resp 17   SpO2 98%   Physical Exam Vitals and nursing note reviewed.  Constitutional:      General: She is not in acute distress.    Appearance: She is well-developed. She is not ill-appearing, toxic-appearing or diaphoretic.  HENT:     Head: Normocephalic and atraumatic.  Eyes:     Conjunctiva/sclera: Conjunctivae normal.     Pupils: Pupils are equal, round, and reactive to light.  Neck:     Trachea: Phonation normal.  Cardiovascular:     Rate and Rhythm: Normal rate and regular rhythm.  Pulmonary:     Effort: Pulmonary effort is normal.     Breath sounds: Normal breath sounds.  Chest:     Chest wall: No tenderness.  Abdominal:     General: There is no distension.     Palpations: Abdomen is soft. There is no mass.     Tenderness: There is abdominal tenderness (Right and left lower quadrant tenderness, mild.). There is no guarding or rebound.     Hernia: No hernia is present.  Musculoskeletal:        General: Normal range of motion.     Cervical back: Normal range of motion and neck supple.  Skin:    General: Skin is warm and dry.  Neurological:     Mental Status: She is alert and oriented to person, place, and time.     Motor: No abnormal muscle tone.  Psychiatric:        Behavior: Behavior normal.        Thought Content: Thought content normal.        Judgment: Judgment normal.     ED Results / Procedures / Treatments   Labs (all labs ordered are listed, but only abnormal results are displayed) Labs Reviewed  URINALYSIS, ROUTINE W REFLEX MICROSCOPIC - Abnormal; Notable for the following components:      Result Value   APPearance HAZY (*)    Leukocytes,Ua LARGE (*)    Bacteria, UA RARE (*)     All other components within normal limits  URINE CULTURE  COMPREHENSIVE METABOLIC PANEL  CBC WITH DIFFERENTIAL/PLATELET    EKG None  Radiology No results found.  Procedures Procedures (including critical care time)  Medications Ordered in ED Medications  sodium chloride 0.9 % bolus 500 mL (500 mLs Intravenous New Bag/Given 07/27/19 2245)  ondansetron (ZOFRAN) injection 4 mg (4 mg Intravenous Given 07/27/19 2244)    ED Course  I have reviewed the triage vital signs and the nursing notes.  Pertinent labs & imaging results that were available during my care of the patient were reviewed by me and considered  in my medical decision making (see chart for details).  Clinical Course as of Jul 27 2335  Mon Jul 27, 2019  2337 Normal except elevated leukocytes, RBCs, white cells and rare bacteria.  Urine culture ordered.  Urinalysis, Routine w reflex microscopic(!) [EW]    Clinical Course User Index [EW] Daleen Bo, MD   MDM Rules/Calculators/A&P                       Patient Vitals for the past 24 hrs:  BP Temp Temp src Pulse Resp SpO2  07/27/19 2300 (!) 170/83 - - 78 17 98 %  07/27/19 2200 (!) 159/83 - - 77 17 98 %  07/27/19 2122 (!) 171/84 98 F (36.7 C) Oral 97 18 97 %    Medical Decision Making: Recurrent symptoms of possible early bowel obstruction with history of ileitis, and colitis.  CT imaging ordered to evaluate for recurrence.  Patient is nontoxic in appearance.  Carrie Watkins was evaluated in Emergency Department on 07/27/2019 for the symptoms described in the history of present illness. She was evaluated in the context of the global COVID-19 pandemic, which necessitated consideration that the patient might be at risk for infection with the SARS-CoV-2 virus that causes COVID-19. Institutional protocols and algorithms that pertain to the evaluation of patients at risk for COVID-19 are in a state of rapid change based on information released by regulatory bodies  including the CDC and federal and state organizations. These policies and algorithms were followed during the patient's care in the ED.   CRITICAL CARE-no Performed by: Daleen Bo  Nursing Notes Reviewed/ Care Coordinated Applicable Imaging Reviewed Interpretation of Laboratory Data incorporated into ED treatment   Plan-CT imaging to evaluate for recurrent bowel obstruction or intestinal infection.  Care to Dr. Christy Gentles to evaluate for disposition post completion of labs, and imaging.  Final Clinical Impression(s) / ED Diagnoses Final diagnoses:  Abdominal pain, unspecified abdominal location  Nausea    Rx / DC Orders ED Discharge Orders    None       Daleen Bo, MD 07/27/19 2338

## 2019-07-27 NOTE — ED Triage Notes (Signed)
Patient reports recent admission for bowel obstruction. Reports abdominal pain with nausea today. States pain mimics recent pain with obstruction. Denies vomiting.

## 2019-07-27 NOTE — ED Provider Notes (Signed)
Plan to f/u CT imaging/labs If negative she can be discharged   Ripley Fraise, MD 07/27/19 2340

## 2019-07-28 ENCOUNTER — Emergency Department (HOSPITAL_COMMUNITY): Payer: Medicare Other

## 2019-07-28 ENCOUNTER — Encounter (HOSPITAL_COMMUNITY): Payer: Self-pay | Admitting: Internal Medicine

## 2019-07-28 DIAGNOSIS — E119 Type 2 diabetes mellitus without complications: Secondary | ICD-10-CM

## 2019-07-28 DIAGNOSIS — F419 Anxiety disorder, unspecified: Secondary | ICD-10-CM | POA: Diagnosis present

## 2019-07-28 DIAGNOSIS — I10 Essential (primary) hypertension: Secondary | ICD-10-CM

## 2019-07-28 DIAGNOSIS — K56600 Partial intestinal obstruction, unspecified as to cause: Secondary | ICD-10-CM | POA: Diagnosis not present

## 2019-07-28 DIAGNOSIS — K219 Gastro-esophageal reflux disease without esophagitis: Secondary | ICD-10-CM | POA: Diagnosis present

## 2019-07-28 DIAGNOSIS — Z79890 Hormone replacement therapy (postmenopausal): Secondary | ICD-10-CM | POA: Diagnosis not present

## 2019-07-28 DIAGNOSIS — K50012 Crohn's disease of small intestine with intestinal obstruction: Secondary | ICD-10-CM | POA: Diagnosis present

## 2019-07-28 DIAGNOSIS — Z7984 Long term (current) use of oral hypoglycemic drugs: Secondary | ICD-10-CM | POA: Diagnosis not present

## 2019-07-28 DIAGNOSIS — Z8616 Personal history of COVID-19: Secondary | ICD-10-CM | POA: Diagnosis not present

## 2019-07-28 DIAGNOSIS — E7800 Pure hypercholesterolemia, unspecified: Secondary | ICD-10-CM | POA: Diagnosis present

## 2019-07-28 DIAGNOSIS — R8281 Pyuria: Secondary | ICD-10-CM | POA: Diagnosis present

## 2019-07-28 DIAGNOSIS — E876 Hypokalemia: Secondary | ICD-10-CM | POA: Diagnosis not present

## 2019-07-28 DIAGNOSIS — Z881 Allergy status to other antibiotic agents status: Secondary | ICD-10-CM | POA: Diagnosis not present

## 2019-07-28 DIAGNOSIS — Z888 Allergy status to other drugs, medicaments and biological substances status: Secondary | ICD-10-CM | POA: Diagnosis not present

## 2019-07-28 DIAGNOSIS — E039 Hypothyroidism, unspecified: Secondary | ICD-10-CM | POA: Diagnosis present

## 2019-07-28 DIAGNOSIS — Z79899 Other long term (current) drug therapy: Secondary | ICD-10-CM | POA: Diagnosis not present

## 2019-07-28 DIAGNOSIS — E785 Hyperlipidemia, unspecified: Secondary | ICD-10-CM | POA: Diagnosis present

## 2019-07-28 DIAGNOSIS — R933 Abnormal findings on diagnostic imaging of other parts of digestive tract: Secondary | ICD-10-CM | POA: Diagnosis not present

## 2019-07-28 DIAGNOSIS — R110 Nausea: Secondary | ICD-10-CM | POA: Diagnosis present

## 2019-07-28 DIAGNOSIS — Z20822 Contact with and (suspected) exposure to covid-19: Secondary | ICD-10-CM | POA: Diagnosis present

## 2019-07-28 LAB — COMPREHENSIVE METABOLIC PANEL
ALT: 20 U/L (ref 0–44)
AST: 21 U/L (ref 15–41)
Albumin: 4.6 g/dL (ref 3.5–5.0)
Alkaline Phosphatase: 86 U/L (ref 38–126)
Anion gap: 10 (ref 5–15)
BUN: 17 mg/dL (ref 8–23)
CO2: 22 mmol/L (ref 22–32)
Calcium: 9.3 mg/dL (ref 8.9–10.3)
Chloride: 108 mmol/L (ref 98–111)
Creatinine, Ser: 0.66 mg/dL (ref 0.44–1.00)
GFR calc Af Amer: >60 mL/min (ref >60)
GFR calc non Af Amer: >60 mL/min (ref >60)
Glucose, Bld: 128 mg/dL — ABNORMAL HIGH (ref 70–99)
Potassium: 3.9 mmol/L (ref 3.5–5.1)
Sodium: 140 mmol/L (ref 135–145)
Total Bilirubin: 0.6 mg/dL (ref 0.3–1.2)
Total Protein: 7.9 g/dL (ref 6.5–8.1)

## 2019-07-28 LAB — CBC WITH DIFFERENTIAL/PLATELET
Abs Immature Granulocytes: 0.02 10*3/uL (ref 0.00–0.07)
Basophils Absolute: 0.1 10*3/uL (ref 0.0–0.1)
Basophils Relative: 1 %
Eosinophils Absolute: 0.1 10*3/uL (ref 0.0–0.5)
Eosinophils Relative: 1 %
HCT: 40.6 % (ref 36.0–46.0)
Hemoglobin: 13.2 g/dL (ref 12.0–15.0)
Immature Granulocytes: 0 %
Lymphocytes Relative: 16 %
Lymphs Abs: 1.6 10*3/uL (ref 0.7–4.0)
MCH: 30.1 pg (ref 26.0–34.0)
MCHC: 32.5 g/dL (ref 30.0–36.0)
MCV: 92.5 fL (ref 80.0–100.0)
Monocytes Absolute: 0.7 10*3/uL (ref 0.1–1.0)
Monocytes Relative: 7 %
Neutro Abs: 7.4 10*3/uL (ref 1.7–7.7)
Neutrophils Relative %: 75 %
Platelets: 200 10*3/uL (ref 150–400)
RBC: 4.39 MIL/uL (ref 3.87–5.11)
RDW: 12.7 % (ref 11.5–15.5)
WBC: 9.8 10*3/uL (ref 4.0–10.5)
nRBC: 0 % (ref 0.0–0.2)

## 2019-07-28 LAB — GLUCOSE, CAPILLARY
Glucose-Capillary: 115 mg/dL — ABNORMAL HIGH (ref 70–99)
Glucose-Capillary: 103 mg/dL — ABNORMAL HIGH (ref 70–99)
Glucose-Capillary: 85 mg/dL (ref 70–99)
Glucose-Capillary: 89 mg/dL (ref 70–99)

## 2019-07-28 LAB — BASIC METABOLIC PANEL
Anion gap: 11 (ref 5–15)
BUN: 12 mg/dL (ref 8–23)
CO2: 21 mmol/L — ABNORMAL LOW (ref 22–32)
Calcium: 8.6 mg/dL — ABNORMAL LOW (ref 8.9–10.3)
Chloride: 104 mmol/L (ref 98–111)
Creatinine, Ser: 0.52 mg/dL (ref 0.44–1.00)
GFR calc Af Amer: >60 mL/min (ref >60)
GFR calc non Af Amer: >60 mL/min (ref >60)
Glucose, Bld: 154 mg/dL — ABNORMAL HIGH (ref 70–99)
Potassium: 4 mmol/L (ref 3.5–5.1)
Sodium: 136 mmol/L (ref 135–145)

## 2019-07-28 LAB — CBC
HCT: 38.7 % (ref 36.0–46.0)
Hemoglobin: 12.5 g/dL (ref 12.0–15.0)
MCH: 30.4 pg (ref 26.0–34.0)
MCHC: 32.3 g/dL (ref 30.0–36.0)
MCV: 94.2 fL (ref 80.0–100.0)
Platelets: 194 10*3/uL (ref 150–400)
RBC: 4.11 MIL/uL (ref 3.87–5.11)
RDW: 12.6 % (ref 11.5–15.5)
WBC: 7.9 10*3/uL (ref 4.0–10.5)
nRBC: 0 % (ref 0.0–0.2)

## 2019-07-28 LAB — CBG MONITORING, ED: Glucose-Capillary: 136 mg/dL — ABNORMAL HIGH (ref 70–99)

## 2019-07-28 MED ORDER — ONDANSETRON HCL 4 MG/2ML IJ SOLN
4.0000 mg | Freq: Four times a day (QID) | INTRAMUSCULAR | Status: DC | PRN
  Administered 2019-07-28 – 2019-08-01 (×6): 4 mg via INTRAVENOUS
  Filled 2019-07-28 (×5): qty 2

## 2019-07-28 MED ORDER — FENTANYL CITRATE (PF) 100 MCG/2ML IJ SOLN
50.0000 ug | Freq: Once | INTRAMUSCULAR | Status: AC
  Administered 2019-07-28: 50 ug via INTRAVENOUS
  Filled 2019-07-28: qty 2

## 2019-07-28 MED ORDER — SODIUM CHLORIDE (PF) 0.9 % IJ SOLN
INTRAMUSCULAR | Status: AC
  Filled 2019-07-28: qty 50

## 2019-07-28 MED ORDER — ONDANSETRON HCL 4 MG PO TABS: 4.0000 mg | ORAL_TABLET | Freq: Four times a day (QID) | ORAL | Status: DC | PRN

## 2019-07-28 MED ORDER — SODIUM CHLORIDE 0.9 % IV BOLUS (SEPSIS)
1000.0000 mL | Freq: Once | INTRAVENOUS | Status: AC
  Administered 2019-07-28: 1000 mL via INTRAVENOUS

## 2019-07-28 MED ORDER — ACETAMINOPHEN 325 MG PO TABS
650.0000 mg | ORAL_TABLET | Freq: Four times a day (QID) | ORAL | Status: DC | PRN
  Administered 2019-07-28 – 2019-07-30 (×3): 650 mg via ORAL
  Filled 2019-07-28 (×4): qty 2

## 2019-07-28 MED ORDER — SERTRALINE HCL 50 MG PO TABS
50.0000 mg | ORAL_TABLET | Freq: Every day | ORAL | Status: DC
  Administered 2019-07-28 – 2019-08-02 (×6): 50 mg via ORAL
  Filled 2019-07-28 (×6): qty 1

## 2019-07-28 MED ORDER — DICYCLOMINE HCL 10 MG PO CAPS
10.0000 mg | ORAL_CAPSULE | Freq: Three times a day (TID) | ORAL | Status: DC | PRN
  Administered 2019-07-30: 10 mg via ORAL
  Filled 2019-07-28: qty 1

## 2019-07-28 MED ORDER — METRONIDAZOLE IN NACL 5-0.79 MG/ML-% IV SOLN
500.0000 mg | Freq: Three times a day (TID) | INTRAVENOUS | Status: DC
  Administered 2019-07-28 – 2019-08-02 (×14): 500 mg via INTRAVENOUS
  Filled 2019-07-28 (×14): qty 100

## 2019-07-28 MED ORDER — IBUPROFEN 200 MG PO TABS
400.0000 mg | ORAL_TABLET | Freq: Four times a day (QID) | ORAL | Status: AC | PRN
  Administered 2019-07-28 – 2019-07-29 (×4): 400 mg via ORAL
  Filled 2019-07-28 (×4): qty 2

## 2019-07-28 MED ORDER — INSULIN ASPART 100 UNIT/ML ~~LOC~~ SOLN
0.0000 [IU] | SUBCUTANEOUS | Status: DC
  Administered 2019-07-30: 1 [IU] via SUBCUTANEOUS
  Filled 2019-07-28: qty 0.09

## 2019-07-28 MED ORDER — ONDANSETRON HCL 4 MG/2ML IJ SOLN
4.0000 mg | Freq: Once | INTRAMUSCULAR | Status: AC
  Administered 2019-07-28: 4 mg via INTRAVENOUS
  Filled 2019-07-28: qty 2

## 2019-07-28 MED ORDER — NORTRIPTYLINE HCL 10 MG PO CAPS
10.0000 mg | ORAL_CAPSULE | Freq: Every day | ORAL | Status: DC
  Administered 2019-07-28 – 2019-08-01 (×5): 10 mg via ORAL
  Filled 2019-07-28 (×6): qty 1

## 2019-07-28 MED ORDER — MECLIZINE HCL 25 MG PO TABS: 25.0000 mg | ORAL_TABLET | Freq: Two times a day (BID) | ORAL | Status: DC | PRN

## 2019-07-28 MED ORDER — ACETAMINOPHEN 650 MG RE SUPP: 650.0000 mg | Freq: Four times a day (QID) | RECTAL | Status: DC | PRN

## 2019-07-28 MED ORDER — IRBESARTAN 150 MG PO TABS
150.0000 mg | ORAL_TABLET | Freq: Every day | ORAL | Status: DC
  Administered 2019-07-28 – 2019-08-02 (×6): 150 mg via ORAL
  Filled 2019-07-28 (×7): qty 1

## 2019-07-28 MED ORDER — SODIUM CHLORIDE 0.9 % IV SOLN: INTRAVENOUS | Status: DC

## 2019-07-28 MED ORDER — LORAZEPAM 0.5 MG PO TABS
0.5000 mg | ORAL_TABLET | Freq: Three times a day (TID) | ORAL | Status: DC | PRN
  Administered 2019-07-28 – 2019-08-02 (×4): 0.5 mg via ORAL
  Filled 2019-07-28 (×4): qty 1

## 2019-07-28 MED ORDER — PANTOPRAZOLE SODIUM 40 MG PO TBEC
80.0000 mg | DELAYED_RELEASE_TABLET | Freq: Every day | ORAL | Status: DC
  Administered 2019-07-28 – 2019-08-02 (×6): 80 mg via ORAL
  Filled 2019-07-28 (×6): qty 2

## 2019-07-28 MED ORDER — SODIUM CHLORIDE 0.9 % IV SOLN
2.0000 g | INTRAVENOUS | Status: DC
  Administered 2019-07-28 – 2019-08-01 (×5): 2 g via INTRAVENOUS
  Filled 2019-07-28 (×3): qty 20
  Filled 2019-07-28 (×2): qty 2

## 2019-07-28 MED ORDER — LEVOTHYROXINE SODIUM 112 MCG PO TABS
112.0000 ug | ORAL_TABLET | Freq: Every day | ORAL | Status: DC
  Administered 2019-07-28 – 2019-08-02 (×6): 112 ug via ORAL
  Filled 2019-07-28 (×6): qty 1

## 2019-07-28 MED ORDER — SIMVASTATIN 10 MG PO TABS
10.0000 mg | ORAL_TABLET | Freq: Every day | ORAL | Status: DC
  Administered 2019-07-28 – 2019-08-01 (×5): 10 mg via ORAL
  Filled 2019-07-28 (×5): qty 1

## 2019-07-28 MED ORDER — MORPHINE SULFATE (PF) 2 MG/ML IV SOLN
2.0000 mg | INTRAVENOUS | Status: DC | PRN
  Administered 2019-07-28 – 2019-07-31 (×7): 2 mg via INTRAVENOUS
  Filled 2019-07-28 (×7): qty 1

## 2019-07-28 MED ORDER — IOHEXOL 300 MG/ML  SOLN
100.0000 mL | Freq: Once | INTRAMUSCULAR | Status: AC | PRN
  Administered 2019-07-28: 100 mL via INTRAVENOUS

## 2019-07-28 MED ORDER — ALPRAZOLAM 0.5 MG PO TABS
0.5000 mg | ORAL_TABLET | Freq: Two times a day (BID) | ORAL | Status: DC
  Administered 2019-07-28 – 2019-08-02 (×11): 0.5 mg via ORAL
  Filled 2019-07-28 (×11): qty 1

## 2019-07-28 MED ORDER — FENTANYL CITRATE (PF) 100 MCG/2ML IJ SOLN
100.0000 ug | Freq: Once | INTRAMUSCULAR | Status: AC
  Administered 2019-07-28: 100 ug via INTRAVENOUS
  Filled 2019-07-28: qty 2

## 2019-07-28 MED ORDER — ENOXAPARIN SODIUM 40 MG/0.4ML ~~LOC~~ SOLN
40.0000 mg | Freq: Every day | SUBCUTANEOUS | Status: DC
  Administered 2019-07-28 – 2019-08-02 (×6): 40 mg via SUBCUTANEOUS
  Filled 2019-07-28 (×6): qty 0.4

## 2019-07-28 NOTE — Progress Notes (Signed)
Briefly seen and examined this morning. Admitted for partial small bowel obstruction Currently on conservative management. GI and General surgery consult obtained. Currently on IV Rocephin and IV azithromycin.-The patient improved with this regimen and her recent admission. There is a suspicion of terminal ileitis, rule out Crohn's disease.  GI states if no improvement with current antibiotics, a trial of IV steroids can be done. Patient has GI as an outpatient in [place] where she will continue to follow if no intervention required in this hospital stay.  Continue sliding scale with Accu-Cheks for DM2 Blood pressure remain elevated to 140-150s.  Valsartan resumed.  Continue other meds.

## 2019-07-28 NOTE — ED Provider Notes (Signed)
Pt found to have partial SBO She still reports abd pain/nausea Plan to admit patient for pain control and monitoring Pt agreeable D/w dr gardner for admission ?UTI but pt denies urinary symptoms, urine culture pending    Ripley Fraise, MD 07/28/19 [phone]

## 2019-07-28 NOTE — ED Notes (Signed)
Attempted to call report. Unable to take report. Stated "still trying to figure it out and we are in the middle of report. Can you call back?"

## 2019-07-28 NOTE — Plan of Care (Signed)

## 2019-07-28 NOTE — Progress Notes (Signed)
RN notified attending MD about patient complaints of headache score 6 out of 10 post tylenol(see MAR). Patient stated that morphine does not help her headache. MD ordered Avil 463m po. Avil administered. Will continue to monitor patient.

## 2019-07-28 NOTE — Progress Notes (Signed)
Occupational Therapy Evaluation/Discharge  Clinical Impression: Patient reports living in a 2 story home with spouse. Patient reports being Independent with all self-care tasks and functional mobility. Overall patient required extra time to complete self-care tasks at Modified Independent level secondary to IV lines. Bed mobility and functional mobility with Modified Independence for extra time with no LOB episodes. Patient performs all self-care tasks and functional mobility at baseline. Patient will be seen by OT for evaluation only. No further OT services at this time.     07/28/19 1421  OT Visit Information  Assistance Needed +1  History of Present Illness The patient is a 74 yr old woman with a past medical history significant for irritable bowel syndrome, Anemia, DM II, GERD, GI Bleed, hyperlipidemia, hypertension, hypothyroidism, anxiety, and depression, cholecystectomy, COVID-19 viral infection 05/15/19 treated at [facility]. Presented to [facility] yesterday evening after she developed periumbilical abdominal pain, nausea and vomiting.  CT of the abdomen and pelvis demonstrated dilated distal small bowel with inflammation at the terminal ileum with thickening of the bowel wall. An NGT was placed resulting in some relief of her symptoms.   CT also demonstrated a left adnexal cyst that demonstrated interval enlargement. Recommendation for that was for outpatient pelvic ultrasound and evaluation by OB/Gyn.  Precautions  Precautions Fall  Restrictions  Weight Bearing Restrictions No  Home Living  Family/patient expects to be discharged to: Private residence  Living Arrangements Spouse/significant other  Available Help at Discharge Family  Type of [place] to enter  Entrance Stairs-Number of Steps 5  [place] Two level;Able to live on main level with bedroom/bathroom;Full bath on main level  Bathroom Shower/Tub Tub/shower unit   Prior Function  Level of Independence Independent  Communication  Communication No difficulties  Pain Assessment  Pain Assessment 0-10  Pain Score 4  Pain Location Abdomen  Pain Descriptors / Indicators Discomfort  Pain Intervention(s) Premedicated before session  Cognition  Arousal/Alertness Awake/alert  Behavior During Therapy WFL for tasks assessed/performed  Upper Extremity Assessment  Upper Extremity Assessment Overall WFL for tasks assessed  Lower Extremity Assessment  Lower Extremity Assessment Defer to PT evaluation  ADL  Overall ADL's  Modified independent  Vision- History  Baseline Vision/History Wears glasses  Wears Glasses At all times  Bed Mobility  Overal bed mobility Modified Independent  Transfers  Overall transfer level Modified independent  Balance  Overall balance assessment Modified Independent  OT - End of Session  Activity Tolerance Patient tolerated treatment well  Patient left in bed;with call bell/phone within reach;with bed alarm set  Nurse Communication Mobility status (Patient request for nausea meds)  OT Assessment  OT Recommendation/Assessment Patient does not need any further OT services  AM-PAC OT "6 Clicks" Daily Activity Outcome Measure (Version 2)  Help from another person eating meals? 4  Help from another person taking care of personal grooming? 4  Help from another person toileting, which includes using toliet, bedpan, or urinal? 4  Help from another person bathing (including washing, rinsing, drying)? 4  Help from another person to put on and taking off regular upper body clothing? 4  Help from another person to put on and taking off regular lower body clothing? 4  6 Click Score 24  OT Recommendation  Follow Up Recommendations No OT follow up  Acute Rehab OT Goals  Patient Stated Goal to return home  OT Goal Formulation With patient  OT Time Calculation  OT Start Time (  ACUTE ONLY) 1414  OT Stop Time (ACUTE ONLY) 1443  OT Time  Calculation (min) 29 min  OT General Charges  $OT Visit 1 Visit  OT Evaluation  $OT Eval Low Complexity 1 Low  OT Treatments  $Self Care/Home Management  8-22 mins  Written Expression  Dominant Hand Right   Destanee Bedonie OTR/L

## 2019-07-28 NOTE — H&P (Addendum)
History and Physical    Carrie Watkins ALP:[id number] DOB: 1945-09-05 DOA: 07/27/2019  PCP: [name], MD  Patient coming from: Home  I have personally briefly reviewed patient's old medical records in [name]  Chief Complaint: Abd pain  HPI: Carrie Watkins is a 74 y.o. female with medical history significant of SBO in Jan, DM2, HTN, GERD.  Had [place] in Dec.  Hospitalized in January for PSBO, follow-up with GI, CAT scan 07/03/2019 and office appointment 07/21/2019.  No additional interventions made at follow-up with GI.  GI didn't think it was inflammatory bowel disease at that time.  Today presents to the ED with similar abd pain and nausea.   She describes onset of lower abdominal pain, bilateral, today with nausea but no vomiting.  She denies fever, chills, cough, shortness of breath, vomiting, back pain, dysuria, urinary frequency or difficulty walking.  ED Course: CT shows PSBO again.   Review of Systems: As per HPI, otherwise all review of systems negative.  Past Medical History:  Diagnosis Date  . Anemia   . Diabetes mellitus without complication ([place])   . GERD (gastroesophageal reflux disease)   . GI bleed   . High cholesterol   . Hypertension   . Thyroid disease     History reviewed. No pertinent surgical history.   reports that she has never smoked. She has never used smokeless tobacco. She reports that she does not drink alcohol. No history on file for drug.  Allergies  Allergen Reactions  . Ciprofloxacin Other (See Comments)    Was told could not take this per  Urologist Was told could not take this per  Urologist   . Prozac [Fluoxetine Hcl]   . Hydralazine Itching and Rash    No family history on file. No sick contacts.  Prior to Admission medications   Medication Sig Start Date End Date Taking? Authorizing Provider  acetaminophen (TYLENOL) 325 MG tablet Take 650 mg by mouth every 6 (six) hours as needed for mild pain or  headache.   Yes [provider]  ALPRAZolam Duanne Moron) 0.5 MG tablet Take 0.5 mg by mouth 2 (two) times daily. 04/29/19  Yes [provider]  Ascorbic Acid (VITAMIN C PO) Take 1 tablet by mouth daily.   Yes [provider]  cholecalciferol (VITAMIN D3) 25 MCG (1000 UNIT) tablet Take 1,000 Units by mouth daily.   Yes [provider]  dicyclomine (BENTYL) 10 MG capsule Take 1 capsule (10 mg total) by mouth 3 (three) times daily as needed for spasms. 03/03/17  Yes Charlesetta Shanks, MD  levothyroxine (SYNTHROID) 112 MCG tablet Take 112 mcg by mouth every morning. 04/27/19  Yes [provider]  LORazepam (ATIVAN) 0.5 MG tablet Take 0.5 mg by mouth every 8 (eight) hours as needed for anxiety.    Yes [provider]  metFORMIN (GLUCOPHAGE-XR) 500 MG 24 hr tablet Take 1,000 mg by mouth daily. 04/06/19  Yes [provider]  Multiple Vitamins-Minerals (ZINC PO) Take 1 tablet by mouth daily.   Yes [provider]  nortriptyline (PAMELOR) 10 MG capsule Take 10 mg by mouth at bedtime. 07/10/19  Yes [provider]  omeprazole (PRILOSEC) 40 MG capsule Take 40 mg by mouth daily.   Yes [provider]  promethazine (PHENERGAN) 12.5 MG tablet Take 12.5 mg by mouth every 6 (six) hours as needed for nausea/vomiting. 06/26/19  Yes [provider]  sertraline (ZOLOFT) 50 MG tablet Take 50 mg by mouth  daily. 04/24/19  Yes [provider]  simvastatin (ZOCOR) 10 MG tablet Take 10 mg by mouth daily.   Yes [provider]  valsartan (DIOVAN) 160 MG tablet Take 160 mg by mouth daily. 03/23/19  Yes [provider]  meclizine (ANTIVERT) 25 MG tablet Take 25 mg by mouth 2 (two) times daily as needed for dizziness.  06/26/19   [provider]    Physical Exam: Vitals:   07/27/19 2300 07/27/19 2330 07/28/19 0000 07/28/19 0030  BP: (!) 170/83 (!) 176/84 (!) 182/86 (!) 182/87  Pulse: 78 83 82 74  Resp:  17 18 19 19   Temp:      TempSrc:      SpO2: 98% 97% 97% 99%    Constitutional: NAD, calm, comfortable Eyes: PERRL, lids and conjunctivae normal ENMT: Mucous membranes are moist. Posterior pharynx clear of any exudate or lesions.Normal dentition.  Neck: normal, supple, no masses, no thyromegaly Respiratory: clear to auscultation bilaterally, no wheezing, no crackles. Normal respiratory effort. No accessory muscle use.  Cardiovascular: Regular rate and rhythm, no murmurs / rubs / gallops. No extremity edema. 2+ pedal pulses. No carotid bruits.  Abdomen: no tenderness, no masses palpated. No hepatosplenomegaly. Bowel sounds positive.  Musculoskeletal: no clubbing / cyanosis. No joint deformity upper and lower extremities. Good ROM, no contractures. Normal muscle tone.  Skin: no rashes, lesions, ulcers. No induration Neurologic: CN 2-12 grossly intact. Sensation intact, DTR normal. Strength 5/5 in all 4.  Psychiatric: Normal judgment and insight. Alert and oriented x 3. Normal mood.    Labs on Admission: I have personally reviewed following labs and imaging studies  CBC: Recent Labs  Lab 07/27/19 2213  WBC 9.8  NEUTROABS 7.4  HGB 13.2  HCT 40.6  MCV 92.5  PLT 638   Basic Metabolic Panel: Recent Labs  Lab 07/27/19 2213  NA 140  K 3.9  CL 108  CO2 22  GLUCOSE 128*  BUN 17  CREATININE 0.66  CALCIUM 9.3   GFR: CrCl cannot be calculated (Unknown ideal weight.). Liver Function Tests: Recent Labs  Lab 07/27/19 2213  AST 21  ALT 20  ALKPHOS 86  BILITOT 0.6  PROT 7.9  ALBUMIN 4.6   No results for input(s): LIPASE, AMYLASE in the last 168 hours. No results for input(s): AMMONIA in the last 168 hours. Coagulation Profile: No results for input(s): INR, PROTIME in the last 168 hours. Cardiac Enzymes: No results for input(s): CKTOTAL, CKMB, CKMBINDEX, TROPONINI in the last 168 hours. BNP (last 3 results) No results for input(s): PROBNP in the last 8760  hours. HbA1C: No results for input(s): HGBA1C in the last 72 hours. CBG: No results for input(s): GLUCAP in the last 168 hours. Lipid Profile: No results for input(s): CHOL, HDL, LDLCALC, TRIG, CHOLHDL, LDLDIRECT in the last 72 hours. Thyroid Function Tests: No results for input(s): TSH, T4TOTAL, FREET4, T3FREE, THYROIDAB in the last 72 hours. Anemia Panel: No results for input(s): VITAMINB12, FOLATE, FERRITIN, TIBC, IRON, RETICCTPCT in the last 72 hours. Urine analysis:    Component Value Date/Time   COLORURINE YELLOW 07/27/2019 2213   APPEARANCEUR HAZY (A) 07/27/2019 2213   LABSPEC 1.028 07/27/2019 2213   PHURINE 5.0 07/27/2019 2213   GLUCOSEU NEGATIVE 07/27/2019 2213   HGBUR NEGATIVE 07/27/2019 2213   BILIRUBINUR NEGATIVE 07/27/2019 2213   KETONESUR NEGATIVE 07/27/2019 2213   PROTEINUR NEGATIVE 07/27/2019 2213   UROBILINOGEN 0.2 12/18/2013 0936   NITRITE NEGATIVE 07/27/2019 2213   LEUKOCYTESUR LARGE (A) 07/27/2019 2213  Radiological Exams on Admission: CT Abdomen Pelvis W Contrast  Result Date: 07/28/2019 CLINICAL DATA:  Abdominal pain and nausea, history of recent small bowel obstruction EXAM: CT ABDOMEN AND PELVIS WITH CONTRAST TECHNIQUE: Multidetector CT imaging of the abdomen and pelvis was performed using the standard protocol following bolus administration of intravenous contrast. CONTRAST:  140m OMNIPAQUE IOHEXOL 300 MG/ML  SOLN COMPARISON:  06/19/2019 FINDINGS: Lower chest: Mild scarring is noted in the bases bilaterally. Hepatobiliary: Fatty infiltration of the liver is seen with multiple scattered cysts. The gallbladder has been surgically removed. Pancreas: Unremarkable. No pancreatic ductal dilatation or surrounding inflammatory changes. Spleen: Normal in size without focal abnormality. Adrenals/Urinary Tract: Right adrenal gland again demonstrates a small adenoma stable from the prior exam. The left adrenal gland is within normal limits. Kidneys demonstrate a normal  enhancement pattern. Small parapelvic cysts are noted bilaterally. No renal calculi or obstructive changes are noted. Bladder is partially distended. Stomach/Bowel: Scattered diverticular changes noted without evidence of diverticulitis. The appendix is within normal limits. Distal ileum shows wall thickening with fatty deposition and fecalization of bowel contents. The small bowel proximal to this is dilated as well similar to that seen on the prior exam. The jejunum appears within normal limits. No gastric abnormality is seen. Vascular/Lymphatic: Aortic atherosclerosis. No enlarged abdominal or pelvic lymph nodes. Reproductive: Uterus is within normal limits. Left ovarian cyst is again noted measuring 3.9 cm relatively stable from the prior exam. Other: No herniation is seen. Minimal free fluid is noted similar to that seen on the prior exam. Musculoskeletal: No acute or significant osseous findings. IMPRESSION: Chronic changes in the distal ileum with fatty deposition within the thickened walls and fecalization of bowel contents consistent with delayed transit. These changes are consistent with a partial small bowel obstruction similar to that seen on the prior exam. 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 No changes of fistulization are identified. The remainder of the exam is stable from the prior study. Electronically Signed   By: MInez CatalinaM.D.   On: 07/28/2019 01:14    EKG: Independently reviewed.  Assessment/Plan Principal Problem:   Small bowel obstruction, partial (HCC) Active Problems:   Diabetes mellitus type 2, controlled, without complications (HCC)   Essential hypertension   Partial small bowel obstruction ([place]    1. PSBO - 1. NPO 2. IVF: NS at 100 3. zofran PRN 4. Morphine PRN 5. Trying to get by without NGT for the moment 6. Call gen surg in AM 2. DM2 - 1. Hold metformin 2. SSI sensitive Q4H 3. HTN - 1. Cont home BP  meds 4. Pyuria - 1. No UTI symptoms 2. No SIRS 3. Will hold off on ABx for now, and check UCx  DVT prophylaxis: Lovenox Code Status: Full Family Communication: No family in room Disposition Plan: Home after admit Consults called: None Admission status: Admit to inpatient  Severity of Illness: The appropriate patient status for this patient is INPATIENT. Inpatient status is judged to be reasonable and necessary in order to provide the required intensity of service to ensure the patient's safety. The patient's presenting symptoms, physical exam findings, and initial radiographic and laboratory data in the context of their chronic comorbidities is felt to place them at high risk for further clinical deterioration. Furthermore, it is not anticipated that the patient will be medically stable for discharge from the hospital within 2 midnights of admission. The following factors support the patient status of inpatient.  IP status for management of PSBO, NPO status.   * I certify that at the point of admission it is my clinical judgment that the patient will require inpatient hospital care spanning beyond 2 midnights from the point of admission due to high intensity of service, high risk for further deterioration and high frequency of surveillance required.*    Teon Hudnall M. DO [facility]  How to contact the [facility] Attending or Consulting provider [place] or covering provider during after hours [place], for this patient?  1. Check the care team in [facility] and look for a) attending/consulting [facility] provider listed and b) the [facility] team listed 2. Log into [email or web address]  Amion Physician Scheduling and messaging for groups and whole hospitals  On call and physician scheduling software for group practices, residents, hospitalists and other medical providers for call, clinic, rotation and shift schedules. On[employer] is a hospital-wide system for scheduling doctors and paging doctors on call.  EasyPlot is for scientific plotting and data analysis.  [email or web address]  and use [place]'s universal password to access. If you do not have the password, please contact the hospital operator.  3. Locate the [facility] provider you are looking for under [facility] and page to a number that you can be directly reached. 4. If you still have difficulty reaching the provider, please page the [facility] (Director on Call) for the Hospitalists listed on amion for assistance.  07/28/2019, 2:01 AM

## 2019-07-28 NOTE — Consult Note (Addendum)
Consultation  Referring Provider: Dr.Dahal    Primary Care Physician:  [name], MD Primary Gastroenterologist: [facility], Rainey Pines, MD     Reason for Consultation: Small bowel obstruction             HPI:   Carrie Watkins is a 74 y.o. female with a past medical history as listed below including IBS, anemia, diabetes and hypertension, who presented to the hospital 07/27/2019 with abdominal pain.    As noted the patient was hospitalized in January for PSBO, followed up with GI as below, CT scan 07/03/2019 and office appointment 07/21/2019.    Today, the patient explains that on Sunday, 07/26/2019 she ate some broccoli salad and shortly afterward started to feel nauseous.  This continued into the next morning and after having some chicken soup for dinner she started with lower abdominal pain.  This is rated as a 5-6/10 and started vomiting "all through the night".  Tells me she last vomited after giving Tylenol at all.  She continues with abdominal pain now as well as nausea.  Explains that this is exactly what happened the last time she was admitted for bowel obstruction.  Does tell me that she passed gas this morning, has not had a bowel movement since 07/26/2019.    Denies fever or chills.  ED course: CT shows PSBO as well as chronic changes in the distal ileum with fatty deposition within thickened walls of equalization of the bowel contents consistent with delayed transit, afebrile but hypertensive  GI history: 07/21/2019 office visit Dr. Willa Rough: Follow-up for small bowel obstruction, at that time discussed that she was admitted to [facility] in January for small bowel obstruction, terminal ileum was noted to be very thick, treated conservatively and discharged on Augmentin, past history significant for duodenal ulcers on EGD in May 2020 thought to be secondary to NSAIDs, last colonoscopy August 2019 showed pandiverticulosis and a small adenomatous polyp as well as small  grade 1 internal hemorrhoids and advised to repeat in 5 years-CT enterography February 5 suggested mild diverticulitis in the short segment of the left colon without evidence of continued small bowel obstruction, ESR and CRP normal, fecal calprotectin ordered but not returned-at that time patient was doing better; at that time discussed I cannot explain ileitis and then diverticulitis, there is concern for IBD but her symptoms improved without steroids and her inflammatory markers were normal-colonoscopy was deferred given recent diverticulitis until at least April-thought reasonable to proceed with a biopsy of the terminal ileum to evaluate for chronic changes   Past Medical History:  Diagnosis Date  . Anemia   . Diabetes mellitus without complication ([place])   . GERD (gastroesophageal reflux disease)   . GI bleed   . High cholesterol   . Hypertension   . Thyroid disease     History reviewed. No pertinent surgical history.  No family history on file.   Social History   Tobacco Use  . Smoking status: Never Smoker  . Smokeless tobacco: Never Used  Substance Use Topics  . Alcohol use: No  . Drug use: Not on file    Prior to Admission medications   Medication Sig Start Date End Date Taking? Authorizing Provider  acetaminophen (TYLENOL) 325 MG tablet Take 650 mg by mouth every 6 (six) hours as needed for mild pain or headache.   Yes [provider]  ALPRAZolam Duanne Moron) 0.5 MG tablet Take 0.5 mg by mouth 2 (two) times daily. 04/29/19  Yes [provider]  Ascorbic Acid (VITAMIN C PO) Take 1 tablet by mouth daily.   Yes [provider]  cholecalciferol (VITAMIN D3) 25 MCG (1000 UNIT) tablet Take 1,000 Units by mouth daily.   Yes [provider]  dicyclomine (BENTYL) 10 MG capsule Take 1 capsule (10 mg total) by mouth 3 (three) times daily as needed for spasms. 03/03/17  Yes Charlesetta Shanks, MD  levothyroxine (SYNTHROID) 112 MCG tablet Take 112 mcg by mouth  every morning. 04/27/19  Yes [provider]  LORazepam (ATIVAN) 0.5 MG tablet Take 0.5 mg by mouth every 8 (eight) hours as needed for anxiety.    Yes [provider]  metFORMIN (GLUCOPHAGE-XR) 500 MG 24 hr tablet Take 1,000 mg by mouth daily. 04/06/19  Yes [provider]  Multiple Vitamins-Minerals (ZINC PO) Take 1 tablet by mouth daily.   Yes [provider]  nortriptyline (PAMELOR) 10 MG capsule Take 10 mg by mouth at bedtime. 07/10/19  Yes [provider]  omeprazole (PRILOSEC) 40 MG capsule Take 40 mg by mouth daily.   Yes [provider]  promethazine (PHENERGAN) 12.5 MG tablet Take 12.5 mg by mouth every 6 (six) hours as needed for nausea/vomiting. 06/26/19  Yes [provider]  sertraline (ZOLOFT) 50 MG tablet Take 50 mg by mouth daily. 04/24/19  Yes [provider]  simvastatin (ZOCOR) 10 MG tablet Take 10 mg by mouth daily.   Yes [provider]  valsartan (DIOVAN) 160 MG tablet Take 160 mg by mouth daily. 03/23/19  Yes [provider]  meclizine (ANTIVERT) 25 MG tablet Take 25 mg by mouth 2 (two) times daily as needed for dizziness.  06/26/19   [provider]    Current Facility-Administered Medications  Medication Dose Route Frequency Provider Last Rate Last Admin  . 0.9 %  sodium chloride infusion   Intravenous Continuous Etta Quill, DO 100 mL/hr at 07/28/19 0242 New Bag at 07/28/19 0242  . acetaminophen (TYLENOL) tablet 650 mg  650 mg Oral Q6H PRN Etta Quill, DO       Or  . acetaminophen (TYLENOL) suppository 650 mg  650 mg Rectal Q6H PRN Etta Quill, DO      . ALPRAZolam Duanne Moron) tablet 0.5 mg  0.5 mg Oral BID Jennette Kettle M, DO      . dicyclomine (BENTYL) capsule 10 mg  10 mg Oral TID PRN Etta Quill, DO      . enoxaparin (LOVENOX) injection 40 mg  40 mg Subcutaneous Daily Alcario Drought, Jared M, DO      . insulin aspart (novoLOG) injection 0-9 Units  0-9 Units  Subcutaneous Q4H Etta Quill, DO   Stopped at 07/28/19 0229  . irbesartan (AVAPRO) tablet 150 mg  150 mg Oral Daily Jennette Kettle M, DO      . levothyroxine (SYNTHROID) tablet 112 mcg  112 mcg Oral Q0600 Etta Quill, DO   112 mcg at 07/28/19 8546  . LORazepam (ATIVAN) tablet 0.5 mg  0.5 mg Oral Q8H PRN Etta Quill, DO   0.5 mg at 07/28/19 0449  . meclizine (ANTIVERT) tablet 25 mg  25 mg Oral BID PRN Etta Quill, DO      . morphine 2 MG/ML injection 2-4 mg  2-4 mg Intravenous Q4H PRN Etta Quill, DO      . nortriptyline (PAMELOR) capsule 10 mg  10 mg Oral QHS Etta Quill, DO      .  ondansetron (ZOFRAN) tablet 4 mg  4 mg Oral Q6H PRN Etta Quill, DO       Or  . ondansetron [facility]) injection 4 mg  4 mg Intravenous Q6H PRN Etta Quill, DO   4 mg at 07/28/19 0841  . pantoprazole (PROTONIX) EC tablet 80 mg  80 mg Oral Daily Alcario Drought, Jared M, DO      . sertraline (ZOLOFT) tablet 50 mg  50 mg Oral Daily Jennette Kettle M, DO      . simvastatin (ZOCOR) tablet 10 mg  10 mg Oral q1800 Etta Quill, DO      . sodium chloride (PF) 0.9 % injection             Allergies as of 07/27/2019 - Review Complete 07/27/2019  Allergen Reaction Noted  . Ciprofloxacin Other (See Comments) 01/07/2019  . Prozac [fluoxetine hcl]  03/03/2017  . Hydralazine Itching and Rash 06/21/2019     Review of Systems:    Constitutional: No weight loss, fever or chills Skin: No rash  Cardiovascular: No chest pain  Respiratory: No SOB  Gastrointestinal: See HPI and otherwise negative Genitourinary: No dysuria  Neurological: No headache Musculoskeletal: No new muscle or joint pain Hematologic: No bleeding Psychiatric: No history of depression or anxiety    Physical Exam:  Vital signs in last 24 hours: Temp:  [97.9 F (36.6 C)-98.3 F (36.8 C)] 97.9 F (36.6 C) (03/02 0758) Pulse Rate:  [64-97] 67 (03/02 0758) Resp:  [14-20] 16 (03/02 0758) BP: (126-184)/(66-94) 157/70  (03/02 0758) SpO2:  [94 %-100 %] 98 % (03/02 0758)   General:   Pleasant Elderly Caucasian female appears to be in NAD, Well developed, Well nourished, alert and cooperative Head:  Normocephalic and atraumatic. Eyes:   PEERL, EOMI. No icterus. Conjunctiva pink. Ears:  Normal auditory acuity. Neck:  Supple Throat: Oral cavity and pharynx without inflammation, swelling or lesion. Teeth in good condition. Lungs: Respirations even and unlabored. Lungs clear to auscultation bilaterally.   No wheezes, crackles, or rhonchi.  Heart: Normal S1, S2. No MRG. Regular rate and rhythm. No peripheral edema, cyanosis or pallor.  Abdomen:  Soft, nondistended, mild generalized ttp. No rebound or guarding. Normal bowel sounds. No appreciable masses or hepatomegaly. Rectal:  Not performed.  Msk:  Symmetrical without gross deformities. Peripheral pulses intact.  Extremities:  Without edema, no deformity or joint abnormality. Normal ROM, normal sensation. Neurologic:  Alert and  oriented x4;  grossly normal neurologically.  Skin:   Dry and intact without significant lesions or rashes. Psychiatric: Demonstrates good judgement and reason without abnormal affect or behaviors.   LAB RESULTS: Recent Labs    07/27/19 2213 07/28/19 0454  WBC 9.8 7.9  HGB 13.2 12.5  HCT 40.6 38.7  PLT 200 194   BMET Recent Labs    07/27/19 2213 07/28/19 0454  NA 140 136  K 3.9 4.0  CL 108 104  CO2 22 21*  GLUCOSE 128* 154*  BUN 17 12  CREATININE 0.66 0.52  CALCIUM 9.3 8.6*   LFT Recent Labs    07/27/19 2213  PROT 7.9  ALBUMIN 4.6  AST 21  ALT 20  ALKPHOS 86  BILITOT 0.6    STUDIES: CT Abdomen Pelvis W Contrast  Result Date: 07/28/2019 CLINICAL DATA:  Abdominal pain and nausea, history of recent small bowel obstruction EXAM: CT ABDOMEN AND PELVIS WITH CONTRAST TECHNIQUE: Multidetector CT imaging of the abdomen and pelvis was performed using the standard protocol following bolus administration  of  intravenous contrast. CONTRAST:  183m OMNIPAQUE IOHEXOL 300 MG/ML  SOLN COMPARISON:  06/19/2019 FINDINGS: Lower chest: Mild scarring is noted in the bases bilaterally. Hepatobiliary: Fatty infiltration of the liver is seen with multiple scattered cysts. The gallbladder has been surgically removed. Pancreas: Unremarkable. No pancreatic ductal dilatation or surrounding inflammatory changes. Spleen: Normal in size without focal abnormality. Adrenals/Urinary Tract: Right adrenal gland again demonstrates a small adenoma stable from the prior exam. The left adrenal gland is within normal limits. Kidneys demonstrate a normal enhancement pattern. Small parapelvic cysts are noted bilaterally. No renal calculi or obstructive changes are noted. Bladder is partially distended. Stomach/Bowel: Scattered diverticular changes noted without evidence of diverticulitis. The appendix is within normal limits. Distal ileum shows wall thickening with fatty deposition and fecalization of bowel contents. The small bowel proximal to this is dilated as well similar to that seen on the prior exam. The jejunum appears within normal limits. No gastric abnormality is seen. Vascular/Lymphatic: Aortic atherosclerosis. No enlarged abdominal or pelvic lymph nodes. Reproductive: Uterus is within normal limits. Left ovarian cyst is again noted measuring 3.9 cm relatively stable from the prior exam. Other: No herniation is seen. Minimal free fluid is noted similar to that seen on the prior exam. Musculoskeletal: No acute or significant osseous findings. IMPRESSION: Chronic changes in the distal ileum with fatty deposition within the thickened walls and fecalization of bowel contents consistent with delayed transit. These changes are consistent with a partial small bowel obstruction similar to that seen on the prior exam. 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 No changes of fistulization are identified. The  remainder of the exam is stable from the prior study. Electronically Signed   By: MInez CatalinaM.D.   On: 07/28/2019 01:14     Impression / Plan:   Impression: 1.  Recurrent small bowel obstruction: She has been followed by her outpatient gastroenterologist with plans for colonoscopy in April given that she was just on antibiotics for diverticulitis, there is discussion about whether or not she has IBD though inflammatory markers have been negative; consider most likely IBD  Plan: 1.  Surgery restarted Rocephin and Flagyl today as patient improved on antibiotics during last hospitalization  2.  Patient is tentatively scheduled for colonoscopy in April by Dr. RWilla Roughher primary GI doctor 3.  If patient improves with conservative measures, likely there is no reason to proceed with colonoscopy during this hospitalization given her recent diverticulitis. 4.  Please await any further recommendations from Dr. SFuller Planlater today.  Thank you for your kind consultation.  JLavone NianLCalvert Health Medical Center 07/28/2019, 10:29 AM     Attending Physician Note   I have taken a history, examined the patient and reviewed the chart. I agree with the Advanced Practitioner's note, impression and recommendations.  Recurrent partial SBO Abnormal distal ileum - R/O Crohn's ileitis   Bowel rest IV antibiotics  If she fails to improve consider corticosteroids for suspected Crohn's ileitis Outpatient GI follow with DRainey Pines MD as planned  MLucio Edward MD [facility] CenterGastroenterology

## 2019-07-28 NOTE — Evaluation (Addendum)
Physical Therapy Evaluation-1x Patient Details Name: Carrie Watkins MRN: [id number] DOB: 1946-01-28 Today's Date: 07/28/2019   History of Present Illness  74 yo female admitted with N/V, SBO. Hx of anemia, DM, anxiety, cholecystectomy, COVID 19, GI bleed  Clinical Impression  Supv-Ind level for mobility. Pt has been mobilizing in the room unassisted. Encouraged pt to walk the hallway several times a day, if able. Instructed pt to notify nursing before she walks the next time so that they are aware. Do not anticipate any PT needs at this time. 1x eval. Will sign off.     Follow Up Recommendations No PT follow up    Equipment Recommendations  None recommended by PT    Recommendations for Other Services       Precautions / Restrictions Precautions Precautions: None Restrictions Weight Bearing Restrictions: No      Mobility  Bed Mobility Overal bed mobility: Independent                Transfers Overall transfer level: Independent                  Ambulation/Gait Ambulation/Gait assistance: Supervision Gait Distance (Feet): 250 Feet Assistive device: None;IV Pole Gait Pattern/deviations: Step-through pattern     General Gait Details: slow but steady gait speed. no lob. no lightheadedness/dizziness  Financial trader Rankin (Stroke Patients Only)       Balance Overall balance assessment: Mild deficits observed, not formally tested                                           Pertinent Vitals/Pain Pain Assessment: 0-10 Pain Score: 7  Pain Location: lower abd Pain Descriptors / Indicators: Discomfort;Sore Pain Intervention(s): Monitored during session    Home Living Family/patient expects to be discharged to:: Private residence Living Arrangements: Spouse/significant other Available Help at Discharge: Family Type of Home: House Home Access: Stairs to enter Entrance Stairs-Rails:  Psychiatric nurse of Steps: 5 Home Layout: Two level;Able to live on main level with bedroom/bathroom;Full bath on main level   Additional Comments: pt has a stationary bike at home for exercise. she is having her bathroom renovated soon and will have a walk-in shower wtih grab bars once done.    Prior Function Level of Independence: Independent               Hand Dominance        Extremity/Trunk Assessment   Upper Extremity Assessment Upper Extremity Assessment: Defer to OT evaluation    Lower Extremity Assessment Lower Extremity Assessment: Overall WFL for tasks assessed    Cervical / Trunk Assessment Cervical / Trunk Assessment: Normal  Communication   Communication: No difficulties  Cognition Arousal/Alertness: Awake/alert Behavior During Therapy: WFL for tasks assessed/performed Overall Cognitive Status: Within Functional Limits for tasks assessed                                        General Comments      Exercises     Assessment/Plan    PT Assessment Patent does not need any further PT services  PT Problem List         PT Treatment Interventions  PT Goals (Current goals can be found in the Care Plan section)  Acute Rehab PT Goals Patient Stated Goal: to get better and get back home to dogs PT Goal Formulation: All assessment and education complete, DC therapy    Frequency     Barriers to discharge        Co-evaluation               AM-PAC PT "6 Clicks" Mobility  Outcome Measure Help needed turning from your back to your side while in a flat bed without using bedrails?: None Help needed moving from lying on your back to sitting on the side of a flat bed without using bedrails?: None Help needed moving to and from a bed to a chair (including a wheelchair)?: None Help needed standing up from a chair using your arms (e.g., wheelchair or bedside chair)?: None Help needed to walk in hospital room?:  None Help needed climbing 3-5 steps with a railing? : None 6 Click Score: 24    End of Session   Activity Tolerance: Patient tolerated treatment well Patient left: in bed;with call bell/phone within reach        Time: 1450-1503 PT Time Calculation (min) (ACUTE ONLY): 13 min   Charges:   PT Evaluation $PT Eval Low Complexity: 1 Low             Jenne Sellinger P, PT Acute Rehabilitation

## 2019-07-28 NOTE — Consult Note (Signed)
[facility] Surgery Consult Note  SKII CLELAND 21-May-1946  [id number].    Requesting MD: B Dahal Chief Complaint: Abdominal pain with nausea Reason for Consult: Ileitis with recurrent small bowel obstruction  HPI:  Carrie Watkins is a 74 year old woman who was admitted from [facility] on 06/20/2019 with abdominal pain nausea and vomiting.  CT scan showed inflammation of the terminal ileum and possible small bowel obstruction.  She is placed on Rocephin and Flagyl.  She was seen by Dr. Jens Som placed on the small bowel protocol.  He improved with this treatment, her diet was advanced, and she was discharged on 06/23/2019.  Since discharge she has seen Dr. Sherrell Puller in [place] her GI doctor and is tentatively scheduled for colonoscopy in April.  She has had an episode of diarrhea about 2 weeks ago and was treated with conservative management at that time.  Her bowel function returned to normal up until yesterday.  She returned to the ED yesterday again with acute onset of abdominal pain, followed by nausea and then vomiting.  She has a history of IBS, and a remote history of a GI bleed about 10 years ago.  Work-up in the ED shows he is afebrile but hypertensive.  Labs are essentially unremarkable.  Glucose elevated at 128 remainder the CMP is normal.  WBC 9.8, H/H 13.2/40.6, platelets 200 K.  Repeat labs this a.m. also show essentially normal BMP, WBC 7.9 urinalysis shows 21-50 white cells large leukocytosis urine cultures pending.  Repeat CT scan completed 1 AM shows chronic changes in the distal ileum with fatty deposition within thickened walls of equalization of the bowel contents consistent with delayed transit.  Changes are consistent with a partial small bowel obstruction similar to that seen on the prior exam 06/19/2019.  The changes in the distal ileum were likely related to inflammatory bowel disease.  No changes of fistulization are identified.  Currently the Carrie Watkins is  comfortable in bed pain is better.  She still has some nausea but has been treated for this.  ROS: Review of Systems  Constitutional: Negative.   HENT: Positive for hearing loss (She has hearing aids but not with her.).   Eyes: Negative.   Respiratory: Positive for wheezing (She has some occasional wheezing none currently.  She has a remote history of asthma with no issues recently.).   Cardiovascular: Negative.   Gastrointestinal: Positive for abdominal pain, constipation, diarrhea, heartburn, nausea and vomiting. Negative for blood in stool and melena.       She has a Hx IBS and it sounds like she has intermittent bouts of constipation and diarrhea.  Genitourinary:       She has a history of cystitis and is on Xanax for this.  Musculoskeletal: Negative.   Skin: Negative.   Neurological: Positive for headaches (Occasional headaches). Negative for dizziness, tingling, tremors, sensory change, speech change, focal weakness, seizures, loss of consciousness and weakness.  Endo/Heme/Allergies: Negative.   Psychiatric/Behavioral: The Carrie Watkins is nervous/anxious.     No family history on file.  Past Medical History:  Diagnosis Date  . Anemia   . Diabetes mellitus without complication ([place])   . GERD (gastroesophageal reflux disease)   . GI bleed   . High cholesterol   . Hypertension   . Thyroid disease     History reviewed. No pertinent surgical history.  Social History:  reports that she has never smoked. She has never used smokeless tobacco. She reports that she does not drink alcohol. No  history on file for drug.  Allergies:  Allergies  Allergen Reactions  . Ciprofloxacin Other (See Comments)    Was told could not take this per  Urologist Was told could not take this per  Urologist   . Prozac [Fluoxetine Hcl]   . Hydralazine Itching and Rash    Medications Prior to Admission  Medication Sig Dispense Refill  . acetaminophen (TYLENOL) 325 MG tablet Take 650 mg by mouth  every 6 (six) hours as needed for mild pain or headache.    . ALPRAZolam (XANAX) 0.5 MG tablet Take 0.5 mg by mouth 2 (two) times daily.    . Ascorbic Acid (VITAMIN C PO) Take 1 tablet by mouth daily.    . cholecalciferol (VITAMIN D3) 25 MCG (1000 UNIT) tablet Take 1,000 Units by mouth daily.    Marland Kitchen dicyclomine (BENTYL) 10 MG capsule Take 1 capsule (10 mg total) by mouth 3 (three) times daily as needed for spasms. 20 capsule 0  . levothyroxine (SYNTHROID) 112 MCG tablet Take 112 mcg by mouth every morning.    Marland Kitchen LORazepam (ATIVAN) 0.5 MG tablet Take 0.5 mg by mouth every 8 (eight) hours as needed for anxiety.     . metFORMIN (GLUCOPHAGE-XR) 500 MG 24 hr tablet Take 1,000 mg by mouth daily.    . Multiple Vitamins-Minerals (ZINC PO) Take 1 tablet by mouth daily.    . nortriptyline (PAMELOR) 10 MG capsule Take 10 mg by mouth at bedtime.   Carrie Watkins says she is not taking this   . omeprazole (PRILOSEC) 40 MG capsule Take 40 mg by mouth daily.    . promethazine (PHENERGAN) 12.5 MG tablet Take 12.5 mg by mouth every 6 (six) hours as needed for nausea/vomiting.    . sertraline (ZOLOFT) 50 MG tablet Take 50 mg by mouth daily.    . simvastatin (ZOCOR) 10 MG tablet Take 10 mg by mouth daily.    . valsartan (DIOVAN) 160 MG tablet Take 160 mg by mouth daily.    . meclizine (ANTIVERT) 25 MG tablet Take 25 mg by mouth 2 (two) times daily as needed for dizziness.       Blood pressure (!) 157/70, pulse 67, temperature 97.9 F (36.6 C), temperature source Oral, resp. rate 16, SpO2 98 %. Physical Exam:  General: pleasant, WD, WN white female in bed, no distress, looks comfortable HEENT: head is normocephalic, atraumatic.  Sclera are noninjected.  Pupils are equal.  Mouth is pink and moist Heart: regular, rate, and rhythm.  Normal s1,s2. No obvious murmurs, gallops, or rubs noted.  Palpable radial and pedal pulses bilaterally Lungs: CTAB, no wheezes, rhonchi, or rales noted.  Respiratory effort nonlabored Abd:  soft, NT, ND, +BS, no masses, hernias, or organomegaly.  Complains of pain both sides below the umbilicus.  There is no area that is particularly tender on palpation currently. MS: all 4 extremities are symmetrical with no cyanosis, clubbing, or edema. Skin: warm and dry with no masses, lesions, or rashes Neuro: Cranial nerves 2-12 grossly intact, sensation is normal throughout Psych: A&Ox3 with an appropriate affect.   Results for orders placed or performed during the hospital encounter of 07/27/19 (from the past 48 hour(s))  Comprehensive metabolic panel     Status: Abnormal   Collection Time: 07/27/19 10:13 PM  Result Value Ref Range   Sodium 140 135 - 145 mmol/L   Potassium 3.9 3.5 - 5.1 mmol/L   Chloride 108 98 - 111 mmol/L   CO2 22 22 - 32 mmol/L  Glucose, Bld 128 (H) 70 - 99 mg/dL    Comment: Glucose reference range applies only to samples taken after fasting for at least 8 hours.   BUN 17 8 - 23 mg/dL   Creatinine, Ser 0.66 0.44 - 1.00 mg/dL   Calcium 9.3 8.9 - 10.3 mg/dL   Total Protein 7.9 6.5 - 8.1 g/dL   Albumin 4.6 3.5 - 5.0 g/dL   AST 21 15 - 41 U/L   ALT 20 0 - 44 U/L   Alkaline Phosphatase 86 38 - 126 U/L   Total Bilirubin 0.6 0.3 - 1.2 mg/dL   GFR calc non Af Amer >60 >60 mL/min   GFR calc Af Amer >60 >60 mL/min   Anion gap 10 5 - 15    Comment: Performed at [facility], [place] [address] [place], [place] [address]  CBC with Differential     Status: None   Collection Time: 07/27/19 10:13 PM  Result Value Ref Range   WBC 9.8 4.0 - 10.5 K/uL   RBC 4.39 3.87 - 5.11 MIL/uL   Hemoglobin 13.2 12.0 - 15.0 g/dL   HCT 40.6 36.0 - 46.0 %   MCV 92.5 80.0 - 100.0 fL   MCH 30.1 26.0 - 34.0 pg   MCHC 32.5 30.0 - 36.0 g/dL   RDW 12.7 11.5 - 15.5 %   Platelets 200 150 - 400 K/uL   nRBC 0.0 0.0 - 0.2 %   Neutrophils Relative % 75 %   Neutro Abs 7.4 1.7 - 7.7 K/uL   Lymphocytes Relative 16 %   Lymphs Abs 1.6 0.7 - 4.0 K/uL   Monocytes Relative 7 %    Monocytes Absolute 0.7 0.1 - 1.0 K/uL   Eosinophils Relative 1 %   Eosinophils Absolute 0.1 0.0 - 0.5 K/uL   Basophils Relative 1 %   Basophils Absolute 0.1 0.0 - 0.1 K/uL   Immature Granulocytes 0 %   Abs Immature Granulocytes 0.02 0.00 - 0.07 K/uL    Comment: Performed at [facility], [place] [address], [place], [place] [address]  Urinalysis, Routine w reflex microscopic     Status: Abnormal   Collection Time: 07/27/19 10:13 PM  Result Value Ref Range   Color, Urine YELLOW YELLOW   APPearance HAZY (A) CLEAR   Specific Gravity, Urine 1.028 1.005 - 1.030   pH 5.0 5.0 - 8.0   Glucose, UA NEGATIVE NEGATIVE mg/dL   Hgb urine dipstick NEGATIVE NEGATIVE   Bilirubin Urine NEGATIVE NEGATIVE   Ketones, ur NEGATIVE NEGATIVE mg/dL   Protein, ur NEGATIVE NEGATIVE mg/dL   Nitrite NEGATIVE NEGATIVE   Leukocytes,Ua LARGE (A) NEGATIVE   RBC / HPF 6-10 0 - 5 RBC/hpf   WBC, UA 21-50 0 - 5 WBC/hpf   Bacteria, UA RARE (A) NONE SEEN   Squamous Epithelial / LPF 0-5 0 - 5   Mucus PRESENT     Comment: Performed at [facility], [place] [address] [place],  [address]  CBG monitoring, ED     Status: Abnormal   Collection Time: 07/28/19  3:57 AM  Result Value Ref Range   Glucose-Capillary 136 (H) 70 - 99 mg/dL    Comment: Glucose reference range applies only to samples taken after fasting for at least 8 hours.  CBC     Status: None   Collection Time: 07/28/19  4:54 AM  Result Value Ref Range   WBC 7.9 4.0 - 10.5 K/uL   RBC 4.11 3.87 -  5.11 MIL/uL   Hemoglobin 12.5 12.0 - 15.0 g/dL   HCT 38.7 36.0 - 46.0 %   MCV 94.2 80.0 - 100.0 fL   MCH 30.4 26.0 - 34.0 pg   MCHC 32.3 30.0 - 36.0 g/dL   RDW 12.6 11.5 - 15.5 %   Platelets 194 150 - 400 K/uL   nRBC 0.0 0.0 - 0.2 %    Comment: Performed at [facility], [place] [address], [place], [place] [address]  Basic metabolic panel     Status: Abnormal   Collection Time: 07/28/19  4:54 AM  Result  Value Ref Range   Sodium 136 135 - 145 mmol/L   Potassium 4.0 3.5 - 5.1 mmol/L   Chloride 104 98 - 111 mmol/L   CO2 21 (L) 22 - 32 mmol/L   Glucose, Bld 154 (H) 70 - 99 mg/dL    Comment: Glucose reference range applies only to samples taken after fasting for at least 8 hours.   BUN 12 8 - 23 mg/dL   Creatinine, Ser 0.52 0.44 - 1.00 mg/dL   Calcium 8.6 (L) 8.9 - 10.3 mg/dL   GFR calc non Af Amer >60 >60 mL/min   GFR calc Af Amer >60 >60 mL/min   Anion gap 11 5 - 15    Comment: Performed at [facility], [place] [address] [place], [place] [address]  Glucose, capillary     Status: Abnormal   Collection Time: 07/28/19  8:30 AM  Result Value Ref Range   Glucose-Capillary 115 (H) 70 - 99 mg/dL    Comment: Glucose reference range applies only to samples taken after fasting for at least 8 hours.   CT Abdomen Pelvis W Contrast  Result Date: 07/28/2019 CLINICAL DATA:  Abdominal pain and nausea, history of recent small bowel obstruction EXAM: CT ABDOMEN AND PELVIS WITH CONTRAST TECHNIQUE: Multidetector CT imaging of the abdomen and pelvis was performed using the standard protocol following bolus administration of intravenous contrast. CONTRAST:  137m OMNIPAQUE IOHEXOL 300 MG/ML  SOLN COMPARISON:  06/19/2019 FINDINGS: Lower chest: Mild scarring is noted in the bases bilaterally. Hepatobiliary: Fatty infiltration of the liver is seen with multiple scattered cysts. The gallbladder has been surgically removed. Pancreas: Unremarkable. No pancreatic ductal dilatation or surrounding inflammatory changes. Spleen: Normal in size without focal abnormality. Adrenals/Urinary Tract: Right adrenal gland again demonstrates a small adenoma stable from the prior exam. The left adrenal gland is within normal limits. Kidneys demonstrate a normal enhancement pattern. Small parapelvic cysts are noted bilaterally. No renal calculi or obstructive changes are noted. Bladder is partially distended. Stomach/Bowel:  Scattered diverticular changes noted without evidence of diverticulitis. The appendix is within normal limits. Distal ileum shows wall thickening with fatty deposition and fecalization of bowel contents. The small bowel proximal to this is dilated as well similar to that seen on the prior exam. The jejunum appears within normal limits. No gastric abnormality is seen. Vascular/Lymphatic: Aortic atherosclerosis. No enlarged abdominal or pelvic lymph nodes. Reproductive: Uterus is within normal limits. Left ovarian cyst is again noted measuring 3.9 cm relatively stable from the prior exam. Other: No herniation is seen. Minimal free fluid is noted similar to that seen on the prior exam. Musculoskeletal: No acute or significant osseous findings. IMPRESSION: Chronic changes in the distal ileum with fatty deposition within the thickened walls and fecalization of bowel contents consistent with delayed transit. These changes are consistent with a partial small bowel obstruction similar to that seen on the prior  exam. 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 No changes of fistulization are identified. The remainder of the exam is stable from the prior study. Electronically Signed   By: Inez Catalina M.D.   On: 07/28/2019 01:14   . sodium chloride 100 mL/hr at 07/28/19 0242      Assessment/Plan Type 2 diabetes Hx of remote history of GI bleed Hypertension Hypothyroid Covid + 04/2019 - antibody  therapy Dyslipidemia History of cystitis Hyperlipidemia  Recurrent ileitis with small bowel obstruction Hx IBD  FEN: IV fluids/n.p.o. ID: We will restart Rocephin and Flagyl DVT: Lovenox ordered Follow-up: TBD  Plan: Carrie Watkins presented with the same problem 06/20/2019.  She improved with antibiotics.  She has a history of IBD and is tentatively scheduled for colonoscopy in April by Dr. Sherrell Puller in [place], [place].  She had acute onset of symptoms last evening.  She had a  previous episode of diarrhea 2 weeks prior that improved with conservative management and resolved.  Her bowel movements were normal up till yesterday and she has had no bowel movement since Sunday, 07/26/2019.  I am going to restart the antibiotics since they appear to work well on her last admission.  Her WBC is normal, which does not really correspond with an infectious process.  I also recommended GI consult.  Currently she has some mild nausea but no further nausea or vomiting since she came in last evening.  She has recurrent nausea or vomiting I would recommend placing an NG tube.    Earnstine Regal [facility] Surgery 07/28/2019, 8:34 AM Please see Amion for pager number during day hours 7:00am-4:30pm

## 2019-07-29 LAB — URINE CULTURE

## 2019-07-29 LAB — GLUCOSE, CAPILLARY
Glucose-Capillary: 104 mg/dL — ABNORMAL HIGH (ref 70–99)
Glucose-Capillary: 119 mg/dL — ABNORMAL HIGH (ref 70–99)
Glucose-Capillary: 68 mg/dL — ABNORMAL LOW (ref 70–99)
Glucose-Capillary: 80 mg/dL (ref 70–99)
Glucose-Capillary: 85 mg/dL (ref 70–99)
Glucose-Capillary: 85 mg/dL (ref 70–99)
Glucose-Capillary: 86 mg/dL (ref 70–99)
Glucose-Capillary: 87 mg/dL (ref 70–99)

## 2019-07-29 MED ORDER — BUTALBITAL-APAP-CAFFEINE 50-325-40 MG PO TABS
1.0000 | ORAL_TABLET | Freq: Four times a day (QID) | ORAL | Status: AC | PRN
  Administered 2019-07-29 – 2019-07-30 (×2): 1 via ORAL
  Filled 2019-07-29 (×2): qty 1

## 2019-07-29 NOTE — Progress Notes (Signed)
PROGRESS NOTE  Carrie Watkins:[id number] DOB: Nov 18, 1945 DOA: 07/27/2019 PCP: [name], MD  HPI/Recap of past 24 hours: Carrie Watkins is a 74 y.o. female with medical history significant of SBO in Jan, DM2, HTN, GERD.  COVID in Dec.  Hospitalized in January for PSBO, follow-up with GI, CAT scan 07/03/2019 and office appointment 07/21/2019. No additional interventions made at follow-up with GI.  GI didn't think it was inflammatory bowel disease at that time.  Presents to the ED with similar abd pain and nausea with no vomiting.    ED Course: CT shows PSBO. TRH asked to admit.  3/3/21Einar Pheasant and examined.  Worried that she has not had a bowel movement yet this AM.  Admits to flatus.  GI and general surgery following.  Currently on IV antibiotics, will continue at this time.  Assessment/Plan: Principal Problem:   Small bowel obstruction, partial (HCC) Active Problems:   Diabetes mellitus type 2, controlled, without complications (HCC)   Essential hypertension   Partial small bowel obstruction (HCC)  PSBO recurrent 1. NPO except sips and meds until cleared by GI 2. IVF: NS at 100 3. zofran PRN 4. Morphine PRN 5. No vomiting, does not have NG tube to suction 6. General surgery and GI following 7. On IV abx rocephin and flagyl, improved previously on abx.  Abnormal distal ileum, rule out crohn's ileitis Per GI consider steroids if fails to improve GI following  Recent diverticulitis Tentative colonoscopy in April by Dr. Willa Rough, primary GI provider.  DM2 type 2 - 1. Hold metformin 2. A1C 6.3 on 06/19/19 3. SSI sensitive Q4H  HTN - BP at goal Cont home BP meds  Asymptomatic Pyuria - 1. No UTI symptoms 2. No SIRS 3. Will hold off on ABx for now, and check UCx  DVT prophylaxis: Lovenox sq daily Code Status: Full Family Communication: will call family if ok with the patient.   Disposition Plan: pt is from home.  Anticipate dc to home once pSBO has  resolved, GI and gen surgery sign off.   Consults called: GI, General surgery       Objective: Vitals:   07/28/19 0758 07/28/19 1337 07/28/19 2045 07/29/19 0446  BP: (!) 157/70 (!) 147/70 (!) 161/76 124/61  Pulse: 67 71 65 69  Resp: 16 18 16 16   Temp: 97.9 F (36.6 C) 97.8 F (36.6 C) 98 F (36.7 C) 97.6 F (36.4 C)  TempSrc: Oral Oral    SpO2: 98% 96% 95% 94%  Weight: 74 kg     Height: 5' 4.02" (1.626 m)       Intake/Output Summary (Last 24 hours) at 07/29/2019 1136 Last data filed at 07/28/2019 1537 Gross per 24 hour  Intake 1259.13 ml  Output --  Net 1259.13 ml   Filed Weights   07/28/19 0758  Weight: 74 kg    Exam:  . General: 74 y.o. year-old female well developed well nourished in no acute distress.  Alert and oriented x3. . Cardiovascular: Regular rate and rhythm with no rubs or gallops.  No thyromegaly or JVD noted.   Marland Kitchen Respiratory: Clear to auscultation with no wheezes or rales. Good inspiratory effort. . Abdomen: Soft nontender nondistended with hypoactive bowel sounds . Musculoskeletal: No lower extremity edema. 2/4 pulses in all 4 extremities. Marland Kitchen Psychiatry: Mood is appropriate for condition and setting   Data Reviewed: CBC: Recent Labs  Lab 07/27/19 2213 07/28/19 0454  WBC 9.8 7.9  NEUTROABS 7.4  --  HGB 13.2 12.5  HCT 40.6 38.7  MCV 92.5 94.2  PLT 200 517   Basic Metabolic Panel: Recent Labs  Lab 07/27/19 2213 07/28/19 0454  NA 140 136  K 3.9 4.0  CL 108 104  CO2 22 21*  GLUCOSE 128* 154*  BUN 17 12  CREATININE 0.66 0.52  CALCIUM 9.3 8.6*   GFR: Estimated Creatinine Clearance: 61.7 mL/min (by C-G formula based on SCr of 0.52 mg/dL). Liver Function Tests: Recent Labs  Lab 07/27/19 2213  AST 21  ALT 20  ALKPHOS 86  BILITOT 0.6  PROT 7.9  ALBUMIN 4.6   No results for input(s): LIPASE, AMYLASE in the last 168 hours. No results for input(s): AMMONIA in the last 168 hours. Coagulation Profile: No results for input(s):  INR, PROTIME in the last 168 hours. Cardiac Enzymes: No results for input(s): CKTOTAL, CKMB, CKMBINDEX, TROPONINI in the last 168 hours. BNP (last 3 results) No results for input(s): PROBNP in the last 8760 hours. HbA1C: No results for input(s): HGBA1C in the last 72 hours. CBG: Recent Labs  Lab 07/28/19 2047 07/28/19 2359 07/29/19 0447 07/29/19 0753 07/29/19 1133  GLUCAP 89 87 86 85 80   Lipid Profile: No results for input(s): CHOL, HDL, LDLCALC, TRIG, CHOLHDL, LDLDIRECT in the last 72 hours. Thyroid Function Tests: No results for input(s): TSH, T4TOTAL, FREET4, T3FREE, THYROIDAB in the last 72 hours. Anemia Panel: No results for input(s): VITAMINB12, FOLATE, FERRITIN, TIBC, IRON, RETICCTPCT in the last 72 hours. Urine analysis:    Component Value Date/Time   COLORURINE YELLOW 07/27/2019 2213   APPEARANCEUR HAZY (A) 07/27/2019 2213   LABSPEC 1.028 07/27/2019 2213   PHURINE 5.0 07/27/2019 2213   GLUCOSEU NEGATIVE 07/27/2019 2213   HGBUR NEGATIVE 07/27/2019 2213   BILIRUBINUR NEGATIVE 07/27/2019 2213   KETONESUR NEGATIVE 07/27/2019 2213   PROTEINUR NEGATIVE 07/27/2019 2213   UROBILINOGEN 0.2 12/18/2013 0936   NITRITE NEGATIVE 07/27/2019 2213   LEUKOCYTESUR LARGE (A) 07/27/2019 2213   Sepsis Labs: @LABRCNTIP (procalcitonin:4,lacticidven:4)  ) Recent Results (from the past 240 hour(s))  Urine culture     Status: None (Preliminary result)   Collection Time: 07/27/19 11:38 PM   Specimen: Urine, Clean Catch  Result Value Ref Range Status   Specimen Description   Final    URINE, CLEAN CATCH Performed at [facility], [place] [address], [place], [place] [address]    Special Requests   Final    NONE Performed at [facility], [place] [address], [place], [place] [address]    Culture   Final    CULTURE REINCUBATED FOR BETTER GROWTH Performed at [facility], [place] [address] [place], [place] [address]    Report Status PENDING   Incomplete      Studies: No results found.  Scheduled Meds: . ALPRAZolam  0.5 mg Oral BID  . enoxaparin (LOVENOX) injection  40 mg Subcutaneous Daily  . insulin aspart  0-9 Units Subcutaneous Q4H  . irbesartan  150 mg Oral Daily  . levothyroxine  112 mcg Oral Q0600  . nortriptyline  10 mg Oral QHS  . pantoprazole  80 mg Oral Daily  . sertraline  50 mg Oral Daily  . simvastatin  10 mg Oral q1800    Continuous Infusions: . sodium chloride 100 mL/hr at 07/29/19 1057  . cefTRIAXone (ROCEPHIN)  IV 2 g (07/28/19 1537)  . metronidazole 500 mg (07/29/19 1059)     LOS: 1 day     Kayleen Memos,  MD [facility] Pager [phone]  If 7PM-7AM, please contact night-coverage [email or web address] Password [facility] 07/29/2019, 11:36 AM

## 2019-07-29 NOTE — Progress Notes (Addendum)
    CC: Abdominal pain with nausea and vomiting  Subjective: She feels better this a.m.  She says she just wished she could have a bowel movement.  We discussed ileitis and I showed her what we were concerned about.  Her family has been doing Chief Financial Officer, and she had something else in mind but cannot find it.  Objective: Vital signs in last 24 hours: Temp:  [97.6 F (36.4 C)-98 F (36.7 C)] 97.6 F (36.4 C) (03/03 0446) Pulse Rate:  [65-71] 69 (03/03 0446) Resp:  [16-18] 16 (03/03 0446) BP: (124-161)/(61-76) 124/61 (03/03 0446) SpO2:  [94 %-96 %] 94 % (03/03 0446)  1259 IV Voided x4 No other output recorded Afebrile vital signs are stable No labs this a.m. No films this a.m.  Intake/Output from previous day: 03/02 0701 - 03/03 0700 In: 1259.1 [I.V.:1259.1] Out: -  Intake/Output this shift: No intake/output data recorded.  General appearance: alert, cooperative and no distress Resp: clear to auscultation bilaterally GI: Soft she is not really tender positive bowel sounds.  No further nausea.  She is having some trouble with headaches.  Lab Results:  Recent Labs    07/27/19 2213 07/28/19 0454  WBC 9.8 7.9  HGB 13.2 12.5  HCT 40.6 38.7  PLT 200 194    BMET Recent Labs    07/27/19 2213 07/28/19 0454  NA 140 136  K 3.9 4.0  CL 108 104  CO2 22 21*  GLUCOSE 128* 154*  BUN 17 12  CREATININE 0.66 0.52  CALCIUM 9.3 8.6*   PT/INR No results for input(s): LABPROT, INR in the last 72 hours.  Recent Labs  Lab 07/27/19 2213  AST 21  ALT 20  ALKPHOS 86  BILITOT 0.6  PROT 7.9  ALBUMIN 4.6     Lipase     Component Value Date/Time   LIPASE 19 06/19/2019 0300     Medications: . ALPRAZolam  0.5 mg Oral BID  . enoxaparin (LOVENOX) injection  40 mg Subcutaneous Daily  . insulin aspart  0-9 Units Subcutaneous Q4H  . irbesartan  150 mg Oral Daily  . levothyroxine  112 mcg Oral Q0600  . nortriptyline  10 mg Oral QHS  . pantoprazole  80 mg Oral Daily   . sertraline  50 mg Oral Daily  . simvastatin  10 mg Oral q1800    Assessment/Plan Type 2 diabetes Hx of remote history of GI bleed Hypertension Hypothyroid Covid + 04/2019 - antibody  therapy Dyslipidemia History of cystitis Hyperlipidemia  Recurrent ileitis with small bowel obstruction Hx IBD  FEN: IV fluids/n.p.o. ID: Rocephin/Flagyl 3/2 >> day 2 DVT: Lovenox  Follow-up: TBD  Plan: Continue antibiotics for now.  We appreciate Dr. Lynne Leader input.  We will follow with you. Ice chips and sips for now.    LOS: 1 day    Benjie Ricketson 07/29/2019 Please see Amion

## 2019-07-29 NOTE — Progress Notes (Signed)
Hypoglycemic Event  CBG: 68  Treatment: 8 oz juice/soda  Symptoms: None  Follow-up CBG: Time:1632 CBG Result:104  Possible Reasons for Event: Inadequate meal intake  Comments/MD notified:Will continue to monitor    Carrie Watkins D Jerline Pain

## 2019-07-29 NOTE — Progress Notes (Addendum)
[facility] Progress Note  CC:  PSBO  Subjective:  Feeling better this AM.  No BM, but is passing some flatus.  Objective:  Vital signs in last 24 hours: Temp:  [97.6 F (36.4 C)-98 F (36.7 C)] 97.6 F (36.4 C) (03/03 0446) Pulse Rate:  [65-71] 69 (03/03 0446) Resp:  [16-18] 16 (03/03 0446) BP: (124-161)/(61-76) 124/61 (03/03 0446) SpO2:  [94 %-96 %] 94 % (03/03 0446) Last BM Date: 07/27/19 General:  Alert, Well-developed, in NAD Heart:  Regular rate and rhythm; no murmurs Pulm:  CTAB.  No increased WOB. Abdomen:  Soft, but still appears somewhat distended.  BS present.  Minimal TTP. Extremities:  Without edema. Neurologic:  Alert and oriented x 4;  grossly normal neurologically. Psych:  Alert and cooperative. Normal mood and affect.  Intake/Output from previous day: 03/02 0701 - 03/03 0700 In: 1259.1 [I.V.:1259.1] Out: -   Lab Results: Recent Labs    07/27/19 2213 07/28/19 0454  WBC 9.8 7.9  HGB 13.2 12.5  HCT 40.6 38.7  PLT 200 194   BMET Recent Labs    07/27/19 2213 07/28/19 0454  NA 140 136  K 3.9 4.0  CL 108 104  CO2 22 21*  GLUCOSE 128* 154*  BUN 17 12  CREATININE 0.66 0.52  CALCIUM 9.3 8.6*   LFT Recent Labs    07/27/19 2213  PROT 7.9  ALBUMIN 4.6  AST 21  ALT 20  ALKPHOS 86  BILITOT 0.6   CT Abdomen Pelvis W Contrast  Result Date: 07/28/2019 CLINICAL DATA:  Abdominal pain and nausea, history of recent small bowel obstruction EXAM: CT ABDOMEN AND PELVIS WITH CONTRAST TECHNIQUE: Multidetector CT imaging of the abdomen and pelvis was performed using the standard protocol following bolus administration of intravenous contrast. CONTRAST:  117m OMNIPAQUE IOHEXOL 300 MG/ML  SOLN COMPARISON:  06/19/2019 FINDINGS: Lower chest: Mild scarring is noted in the bases bilaterally. Hepatobiliary: Fatty infiltration of the liver is seen with multiple scattered cysts. The gallbladder has been surgically removed. Pancreas: Unremarkable. No  pancreatic ductal dilatation or surrounding inflammatory changes. Spleen: Normal in size without focal abnormality. Adrenals/Urinary Tract: Right adrenal gland again demonstrates a small adenoma stable from the prior exam. The left adrenal gland is within normal limits. Kidneys demonstrate a normal enhancement pattern. Small parapelvic cysts are noted bilaterally. No renal calculi or obstructive changes are noted. Bladder is partially distended. Stomach/Bowel: Scattered diverticular changes noted without evidence of diverticulitis. The appendix is within normal limits. Distal ileum shows wall thickening with fatty deposition and fecalization of bowel contents. The small bowel proximal to this is dilated as well similar to that seen on the prior exam. The jejunum appears within normal limits. No gastric abnormality is seen. Vascular/Lymphatic: Aortic atherosclerosis. No enlarged abdominal or pelvic lymph nodes. Reproductive: Uterus is within normal limits. Left ovarian cyst is again noted measuring 3.9 cm relatively stable from the prior exam. Other: No herniation is seen. Minimal free fluid is noted similar to that seen on the prior exam. Musculoskeletal: No acute or significant osseous findings. IMPRESSION: Chronic changes in the distal ileum with fatty deposition within the thickened walls and fecalization of bowel contents consistent with delayed transit. These changes are consistent with a partial small bowel obstruction similar to that seen on the prior exam. 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 No changes of fistulization are identified. The remainder of the exam is stable from the prior study. Electronically Signed  By: Inez Catalina M.D.   On: 07/28/2019 01:14   Assessment / Plan: 1.  Recurrent small bowel obstruction: She has been followed by her outpatient gastroenterologist with plans for colonoscopy in April given that she was just on antibiotics for  diverticulitis, there is discussion about whether or not she has IBD though inflammatory markers have been negative; consider most likely IBD/Crohn's ileitis.  -Surgery restarted Rocephin and Flagyl today as patient improved on antibiotics during last hospitalization.  -Patient is tentatively scheduled for colonoscopy in April by Dr. Willa Rough her primary GI doctor -Hopefully can hold off on starting steroids since she's already improving. -Diet per surgery.   LOS: 1 day   Laban Emperor. Zehr  07/29/2019, 9:04 AM     Attending Physician Note   I have taken an interval history, reviewed the chart and examined the patient. I agree with the Advanced Practitioner's note, impression and recommendations.   Lucio Edward, MD [facility]

## 2019-07-30 LAB — PHOSPHORUS: Phosphorus: 3.7 mg/dL (ref 2.5–4.6)

## 2019-07-30 LAB — GLUCOSE, CAPILLARY
Glucose-Capillary: 87 mg/dL (ref 70–99)
Glucose-Capillary: 88 mg/dL (ref 70–99)
Glucose-Capillary: 144 mg/dL — ABNORMAL HIGH (ref 70–99)
Glucose-Capillary: 103 mg/dL — ABNORMAL HIGH (ref 70–99)
Glucose-Capillary: 154 mg/dL — ABNORMAL HIGH (ref 70–99)

## 2019-07-30 LAB — CBC
HCT: 33.5 % — ABNORMAL LOW (ref 36.0–46.0)
Hemoglobin: 10.8 g/dL — ABNORMAL LOW (ref 12.0–15.0)
MCH: 29.8 pg (ref 26.0–34.0)
MCHC: 32.2 g/dL (ref 30.0–36.0)
MCV: 92.3 fL (ref 80.0–100.0)
Platelets: 151 10*3/uL (ref 150–400)
RBC: 3.63 MIL/uL — ABNORMAL LOW (ref 3.87–5.11)
RDW: 12.6 % (ref 11.5–15.5)
WBC: 4 10*3/uL (ref 4.0–10.5)
nRBC: 0 % (ref 0.0–0.2)

## 2019-07-30 LAB — MAGNESIUM: Magnesium: 1.7 mg/dL (ref 1.7–2.4)

## 2019-07-30 LAB — BASIC METABOLIC PANEL
Anion gap: 12 (ref 5–15)
BUN: 8 mg/dL (ref 8–23)
CO2: 20 mmol/L — ABNORMAL LOW (ref 22–32)
Calcium: 8.4 mg/dL — ABNORMAL LOW (ref 8.9–10.3)
Chloride: 107 mmol/L (ref 98–111)
Creatinine, Ser: 0.6 mg/dL (ref 0.44–1.00)
GFR calc Af Amer: >60 mL/min (ref >60)
GFR calc non Af Amer: >60 mL/min (ref >60)
Glucose, Bld: 90 mg/dL (ref 70–99)
Potassium: 3.3 mmol/L — ABNORMAL LOW (ref 3.5–5.1)
Sodium: 139 mmol/L (ref 135–145)

## 2019-07-30 MED ORDER — INSULIN ASPART 100 UNIT/ML ~~LOC~~ SOLN
0.0000 [IU] | Freq: Three times a day (TID) | SUBCUTANEOUS | Status: DC
  Administered 2019-07-31 (×2): 1 [IU] via SUBCUTANEOUS
  Administered 2019-08-01 (×2): 2 [IU] via SUBCUTANEOUS

## 2019-07-30 MED ORDER — DEXTROSE-NACL 5-0.9 % IV SOLN: INTRAVENOUS | Status: DC

## 2019-07-30 MED ORDER — MAGNESIUM SULFATE 2 GM/50ML IV SOLN
2.0000 g | Freq: Once | INTRAVENOUS | Status: AC
  Administered 2019-07-30: 2 g via INTRAVENOUS
  Filled 2019-07-30: qty 50

## 2019-07-30 MED ORDER — INSULIN ASPART 100 UNIT/ML ~~LOC~~ SOLN
0.0000 [IU] | Freq: Every day | SUBCUTANEOUS | Status: DC
  Administered 2019-08-01: 2 [IU] via SUBCUTANEOUS

## 2019-07-30 MED ORDER — POTASSIUM CHLORIDE 10 MEQ/100ML IV SOLN
10.0000 meq | INTRAVENOUS | Status: AC
  Administered 2019-07-30 (×4): 10 meq via INTRAVENOUS
  Filled 2019-07-30 (×3): qty 100

## 2019-07-30 NOTE — Progress Notes (Signed)
    CC: Abdominal pain  Subjective: She is doing better having bowel movements.  She did well with ice chips and sips.  She is hungry and Dr. Fuller Plan is placed her on clear liquids with plan to go to full's later.  Objective: Vital signs in last 24 hours: Temp:  [97.5 F (36.4 C)-98.2 F (36.8 C)] 97.5 F (36.4 C) (03/04 0502) Pulse Rate:  [63-76] 63 (03/04 0502) Resp:  [18] 18 (03/04 0502) BP: (147-170)/(67-72) 147/67 (03/04 0502) SpO2:  [95 %-96 %] 95 % (03/04 0502) Last BM Date: 07/27/19  Urine x7 No BM or emesis Nothing else recorded on I&O . dextrose 5 % and 0.9% NaCl @75  cc/hr    No radiology exams this a.m. Afebrile vital signs are stable Potassium 3.3 WBC 4.0 H/H 10.8/33.5 output from previous day: No intake/output data recorded. Intake/Output this shift: No intake/output data recorded.  General appearance: alert, cooperative and no distress Resp: clear to auscultation bilaterally GI: Still some soreness but overall feeling much better.  Positive bowel sounds, positive BM.  Lab Results:  Recent Labs    07/28/19 0454 07/30/19 0624  WBC 7.9 4.0  HGB 12.5 10.8*  HCT 38.7 33.5*  PLT 194 151    BMET Recent Labs    07/28/19 0454 07/30/19 0624  NA 136 139  K 4.0 3.3*  CL 104 107  CO2 21* 20*  GLUCOSE 154* 90  BUN 12 8  CREATININE 0.52 0.60  CALCIUM 8.6* 8.4*   PT/INR No results for input(s): LABPROT, INR in the last 72 hours.  Recent Labs  Lab 07/27/19 2213  AST 21  ALT 20  ALKPHOS 86  BILITOT 0.6  PROT 7.9  ALBUMIN 4.6     Lipase     Component Value Date/Time   LIPASE 19 06/19/2019 0300     Medications: . ALPRAZolam  0.5 mg Oral BID  . enoxaparin (LOVENOX) injection  40 mg Subcutaneous Daily  . insulin aspart  0-9 Units Subcutaneous Q4H  . irbesartan  150 mg Oral Daily  . levothyroxine  112 mcg Oral Q0600  . nortriptyline  10 mg Oral QHS  . pantoprazole  80 mg Oral Daily  . sertraline  50 mg Oral Daily  . simvastatin  10 mg  Oral q1800    Assessment/Plan Type 2 diabetes Hxofremote history of GI bleed Hypertension Hypothyroid Covid + 04/2019- antibodytherapy Dyslipidemia History of cystitis Hyperlipidemia  Recurrent ileitis with small bowel obstruction Hx IBD  FEN:IV fluids/n.p.o. ID: Rocephin/Flagyl 3/2 >> day 3 DVT: Lovenox  Follow-up: TBD  Plan: Overall making good improvement on antibiotics.  Discussed with Dr. Fuller Plan they will follow.  She is making good progress on antibiotics if that fails they would place her on steroids.  They hope to get a diagnosis before placing her on steroids if possible.  No surgical needs currently.  We will be available if needed.    LOS: 2 days    Carrie Watkins 07/30/2019 Please see Amion

## 2019-07-30 NOTE — Progress Notes (Addendum)
    Progress Note   Subjective  She report several bowel movement, passing flatus, minimal abdominal pain   Objective  Vital signs in last 24 hours: Temp:  [97.5 F (36.4 C)-98.2 F (36.8 C)] 97.5 F (36.4 C) (03/04 0502) Pulse Rate:  [63-76] 63 (03/04 0502) Resp:  [18] 18 (03/04 0502) BP: (147-170)/(67-72) 147/67 (03/04 0502) SpO2:  [95 %-96 %] 95 % (03/04 0502) Last BM Date: 07/29/19  General: Alert, well-developed, in NAD Heart:  Regular rate and rhythm; no murmurs Chest: Clear to ascultation bilaterally Abdomen:  Soft, minimal lower abdominal tenderness and nondistended. Normal bowel sounds, without guarding, and without rebound.   Extremities:  Without edema. Neurologic:  Alert and  oriented x4; grossly normal neurologically. Psych:  Alert and cooperative. Normal mood and affect.  Intake/Output from previous day: No intake/output data recorded. Intake/Output this shift: No intake/output data recorded.  Lab Results: Recent Labs    07/27/19 2213 07/28/19 0454 07/30/19 0624  WBC 9.8 7.9 4.0  HGB 13.2 12.5 10.8*  HCT 40.6 38.7 33.5*  PLT 200 194 151   BMET Recent Labs    07/27/19 2213 07/28/19 0454 07/30/19 0624  NA 140 136 139  K 3.9 4.0 3.3*  CL 108 104 107  CO2 22 21* 20*  GLUCOSE 128* 154* 90  BUN 17 12 8   CREATININE 0.66 0.52 0.60  CALCIUM 9.3 8.6* 8.4*   LFT Recent Labs    07/27/19 2213  PROT 7.9  ALBUMIN 4.6  AST 21  ALT 20  ALKPHOS 86  BILITOT 0.6      Assessment & Recommendations   1. Partial SBO, resolving. Clear liquid diet this morning and advance to full liquid diet later today as tolerated. If partial SBO returns will begin corticosteroids for suspected Crohn's ileitis. Continue IV antibiotics for now.  Colonoscopy with intent to examine TI with Dr. Sherrell Puller as outpatient within a couple weeks    LOS: 2 days   Norberto Sorenson T. Fuller Plan MD 07/30/2019, 9:29 AM

## 2019-07-30 NOTE — Progress Notes (Signed)
PROGRESS NOTE  Carrie Watkins PQZ:[id number] DOB: 1945/11/15 DOA: 07/27/2019 PCP: Carrie File, MD  HPI/Recap of past 24 hours: Carrie Watkins is a 74 y.o. female with medical history significant of SBO in Jan, DM2, HTN, GERD.  COVID in Dec.  Hospitalized in January for PSBO, follow-up with GI, CAT scan 07/03/2019 and office appointment 07/21/2019. No additional interventions made at follow-up with GI.  GI didn't think it was inflammatory bowel disease at that time.  Presents to the ED with similar abd pain and nausea with no vomiting.    ED Course: CT shows PSBO. TRH asked to admit.  07/30/19: Seen and examined.  Reports positive flatus and bowel movements.  Asking for diet.  Started on clear liquids with plan to advance to full liquid today as tolerated.  GI and general surgery directing care.  Assessment/Plan: Principal Problem:   Small bowel obstruction, partial (HCC) Active Problems:   Diabetes mellitus type 2, controlled, without complications (HCC)   Essential hypertension   Partial small bowel obstruction (HCC)  PSBO recurrent, resolving 1. Positive flatus and bowel movement 2. Started on clear liquid diet 3. Advance to full liquid diet today as tolerated.  Per GI if partial small bowel obstruction returns will start on corticosteroids for suspected Crohn's ileitis. 4. Continue supportive care 5. Continue IV antibiotics empirically as recommended by GI and general surgery. 6. On IV abx rocephin and flagyl  Abnormal distal ileum, rule out crohn's ileitis Per GI consider steroids if fails to improve GI following  Recent diverticulitis Tentative colonoscopy in April by Dr. Willa Rough, primary GI provider.  DM2 type 2 - 1. Hold metformin 2. A1C 6.3 on 06/19/19 3. ISS  HTN - BP at goal Cont home BP meds  Asymptomatic Pyuria - 1. No UTI symptoms 2. No SIRS 3. Will hold off on ABx for now, and check UCx  Chronic anxiety Continue home medications She is also on  p.o. Xanax chronically  Hypothyroidism Continue levothyroxine  Hyperlipidemia Continue Zocor  GERD Continue PPI  History of COVID-19 infection Positive COVID-19 test on 05/15/2019. Asymptomatic, no respiratory symptoms    DVT prophylaxis: Lovenox sq daily Code Status: Full Family Communication: will call family if ok with the patient.   Disposition Plan: pt is from home.  Anticipate dc to home once pSBO has resolved, GI and gen surgery sign off.   Consults called: GI, General surgery       Objective: Vitals:   07/29/19 1407 07/29/19 2014 07/30/19 0502 07/30/19 1354  BP: (!) 170/70 (!) 155/72 (!) 147/67 (!) 157/72  Pulse: 76 66 63 67  Resp: 18 18 18 18   Temp: 98.1 F (36.7 C) 98.2 F (36.8 C) (!) 97.5 F (36.4 C) 97.8 F (36.6 C)  TempSrc: Oral Oral Oral Oral  SpO2: 96% 96% 95% 99%  Weight:      Height:        Intake/Output Summary (Last 24 hours) at 07/30/2019 1456 Last data filed at 07/30/2019 0900 Gross per 24 hour  Intake 240 ml  Output --  Net 240 ml   Filed Weights   07/28/19 0758  Weight: 74 kg    Exam:  . General: 74 y.o. year-old female pleasant well-developed well-nourished no acute distress.  Alert and oriented x3.   . Cardiovascular: Regular rate and rhythm no rubs or gallops.   Marland Kitchen Respiratory: Clear to auscultation with no wheezes or rales.  Good inspiratory effort. . Abdomen: Soft nontender normal bowel sounds present.   Marland Kitchen  Musculoskeletal: No lower extremity edema bilaterally. Marland Kitchen Psychiatry: Mood is appropriate for condition and setting.  Data Reviewed: CBC: Recent Labs  Lab 07/27/19 2213 07/28/19 0454 07/30/19 0624  WBC 9.8 7.9 4.0  NEUTROABS 7.4  --   --   HGB 13.2 12.5 10.8*  HCT 40.6 38.7 33.5*  MCV 92.5 94.2 92.3  PLT 200 194 597   Basic Metabolic Panel: Recent Labs  Lab 07/27/19 2213 07/28/19 0454 07/30/19 0624  NA 140 136 139  K 3.9 4.0 3.3*  CL 108 104 107  CO2 22 21* 20*  GLUCOSE 128* 154* 90  BUN 17 12 8    CREATININE 0.66 0.52 0.60  CALCIUM 9.3 8.6* 8.4*  MG  --   --  1.7  PHOS  --   --  3.7   GFR: Estimated Creatinine Clearance: 61.7 mL/min (by C-G formula based on SCr of 0.6 mg/dL). Liver Function Tests: Recent Labs  Lab 07/27/19 2213  AST 21  ALT 20  ALKPHOS 86  BILITOT 0.6  PROT 7.9  ALBUMIN 4.6   No results for input(s): LIPASE, AMYLASE in the last 168 hours. No results for input(s): AMMONIA in the last 168 hours. Coagulation Profile: No results for input(s): INR, PROTIME in the last 168 hours. Cardiac Enzymes: No results for input(s): CKTOTAL, CKMB, CKMBINDEX, TROPONINI in the last 168 hours. BNP (last 3 results) No results for input(s): PROBNP in the last 8760 hours. HbA1C: No results for input(s): HGBA1C in the last 72 hours. CBG: Recent Labs  Lab 07/29/19 2016 07/29/19 2344 07/30/19 0503 07/30/19 0723 07/30/19 1148  GLUCAP 119* 85 87 88 144*   Lipid Profile: No results for input(s): CHOL, HDL, LDLCALC, TRIG, CHOLHDL, LDLDIRECT in the last 72 hours. Thyroid Function Tests: No results for input(s): TSH, T4TOTAL, FREET4, T3FREE, THYROIDAB in the last 72 hours. Anemia Panel: No results for input(s): VITAMINB12, FOLATE, FERRITIN, TIBC, IRON, RETICCTPCT in the last 72 hours. Urine analysis:    Component Value Date/Time   COLORURINE YELLOW 07/27/2019 2213   APPEARANCEUR HAZY (A) 07/27/2019 2213   LABSPEC 1.028 07/27/2019 2213   PHURINE 5.0 07/27/2019 2213   GLUCOSEU NEGATIVE 07/27/2019 2213   HGBUR NEGATIVE 07/27/2019 2213   BILIRUBINUR NEGATIVE 07/27/2019 2213   KETONESUR NEGATIVE 07/27/2019 2213   PROTEINUR NEGATIVE 07/27/2019 2213   UROBILINOGEN 0.2 12/18/2013 0936   NITRITE NEGATIVE 07/27/2019 2213   LEUKOCYTESUR LARGE (A) 07/27/2019 2213   Sepsis Labs: @LABRCNTIP (procalcitonin:4,lacticidven:4)  ) Recent Results (from the past 240 hour(s))  Urine culture     Status: Abnormal   Collection Time: 07/27/19 11:38 PM   Specimen: Urine, Clean Catch   Result Value Ref Range Status   Specimen Description   Final    URINE, CLEAN CATCH Performed at [facility], [place] [address] [place], [place] [address]    Special Requests   Final    NONE Performed at [facility], [place] [address] [place], [place] [address]    Culture MULTIPLE SPECIES PRESENT, SUGGEST RECOLLECTION (A)  Final   Report Status 07/29/2019 FINAL  Final      Studies: No results found.  Scheduled Meds: . ALPRAZolam  0.5 mg Oral BID  . enoxaparin (LOVENOX) injection  40 mg Subcutaneous Daily  . insulin aspart  0-9 Units Subcutaneous Q4H  . irbesartan  150 mg Oral Daily  . levothyroxine  112 mcg Oral Q0600  . nortriptyline  10 mg Oral QHS  . pantoprazole  80 mg Oral Daily  .  sertraline  50 mg Oral Daily  . simvastatin  10 mg Oral q1800    Continuous Infusions: . cefTRIAXone (ROCEPHIN)  IV 2 g (07/29/19 1834)  . dextrose 5 % and 0.9% NaCl 75 mL/hr at 07/30/19 0827  . metronidazole 500 mg (07/30/19 0829)     LOS: 2 days     Kayleen Memos, MD [facility] Pager [phone]  If 7PM-7AM, please contact night-coverage [email or web address] Password [facility] 07/30/2019, 2:56 PM

## 2019-07-31 LAB — BASIC METABOLIC PANEL
Anion gap: 6 (ref 5–15)
BUN: <5 mg/dL — ABNORMAL LOW (ref 8–23)
CO2: 23 mmol/L (ref 22–32)
Calcium: 8.4 mg/dL — ABNORMAL LOW (ref 8.9–10.3)
Chloride: 109 mmol/L (ref 98–111)
Creatinine, Ser: 0.56 mg/dL (ref 0.44–1.00)
GFR calc Af Amer: >60 mL/min (ref >60)
GFR calc non Af Amer: >60 mL/min (ref >60)
Glucose, Bld: 133 mg/dL — ABNORMAL HIGH (ref 70–99)
Potassium: 3.4 mmol/L — ABNORMAL LOW (ref 3.5–5.1)
Sodium: 138 mmol/L (ref 135–145)

## 2019-07-31 LAB — CBC WITH DIFFERENTIAL/PLATELET
Abs Immature Granulocytes: 0.01 10*3/uL (ref 0.00–0.07)
Basophils Absolute: 0 10*3/uL (ref 0.0–0.1)
Basophils Relative: 1 %
Eosinophils Absolute: 0.1 10*3/uL (ref 0.0–0.5)
Eosinophils Relative: 4 %
HCT: 33.4 % — ABNORMAL LOW (ref 36.0–46.0)
Hemoglobin: 10.8 g/dL — ABNORMAL LOW (ref 12.0–15.0)
Immature Granulocytes: 0 %
Lymphocytes Relative: 27 %
Lymphs Abs: 1.1 10*3/uL (ref 0.7–4.0)
MCH: 29.8 pg (ref 26.0–34.0)
MCHC: 32.3 g/dL (ref 30.0–36.0)
MCV: 92.3 fL (ref 80.0–100.0)
Monocytes Absolute: 0.5 10*3/uL (ref 0.1–1.0)
Monocytes Relative: 12 %
Neutro Abs: 2.2 10*3/uL (ref 1.7–7.7)
Neutrophils Relative %: 56 %
Platelets: 153 10*3/uL (ref 150–400)
RBC: 3.62 MIL/uL — ABNORMAL LOW (ref 3.87–5.11)
RDW: 12.8 % (ref 11.5–15.5)
WBC: 3.9 10*3/uL — ABNORMAL LOW (ref 4.0–10.5)
nRBC: 0 % (ref 0.0–0.2)

## 2019-07-31 LAB — GLUCOSE, CAPILLARY
Glucose-Capillary: 141 mg/dL — ABNORMAL HIGH (ref 70–99)
Glucose-Capillary: 136 mg/dL — ABNORMAL HIGH (ref 70–99)
Glucose-Capillary: 96 mg/dL (ref 70–99)
Glucose-Capillary: 241 mg/dL — ABNORMAL HIGH (ref 70–99)

## 2019-07-31 LAB — MAGNESIUM: Magnesium: 1.7 mg/dL (ref 1.7–2.4)

## 2019-07-31 MED ORDER — METHYLPREDNISOLONE SODIUM SUCC 125 MG IJ SOLR
60.0000 mg | Freq: Every day | INTRAMUSCULAR | Status: DC
  Administered 2019-07-31 – 2019-08-01 (×2): 60 mg via INTRAVENOUS
  Filled 2019-07-31 (×2): qty 2

## 2019-07-31 NOTE — Progress Notes (Signed)
Updated the patient's spouse Fritz Pickerel via phone.  All questions answered.

## 2019-07-31 NOTE — Progress Notes (Signed)
     [facility] Progress Note  CC:  PSBO  Subjective:  Occasional abdominal pain on full liquids  Objective:  Vital signs in last 24 hours: Temp:  [97.4 F (36.3 C)-97.8 F (36.6 C)] 97.5 F (36.4 C) (03/05 0532) Pulse Rate:  [57-67] 57 (03/05 0532) Resp:  [16-18] 16 (03/05 0532) BP: (148-157)/(63-78) 148/63 (03/05 0532) SpO2:  [96 %-99 %] 96 % (03/05 0532) Last BM Date: 07/29/19 General:   Alert,  Well-developed,    in NAD Heart:  Regular rate and rhythm; no murmurs Pulm; Abdomen:  Soft, nontender and mildly distended. Normal bowel sounds, without guarding, and without rebound.   Extremities:  Without edema. Neurologic:  Alert and  oriented x4;  grossly normal neurologically. Psych:  Alert and cooperative. Normal mood and affect.  Intake/Output from previous day: 03/04 0701 - 03/05 0700 In: 2912.5 [P.O.:1080; I.V.:1.7; IV Piggyback:1830.8] Out: -    Lab Results: Recent Labs    07/30/19 0624 07/31/19 0620  WBC 4.0 3.9*  HGB 10.8* 10.8*  HCT 33.5* 33.4*  PLT 151 153   BMET Recent Labs    07/30/19 0624 07/31/19 0620  NA 139 138  K 3.3* 3.4*  CL 107 109  CO2 20* 23  GLUCOSE 90 133*  BUN 8 <5*  CREATININE 0.60 0.56  CALCIUM 8.4* 8.4*    Assessment / Plan: 1. Partial SBO, resolving. Continue full liquids, do not advance. Start solumedrol for presumed Crohn's ileitis. Continue IV antibiotics for now.  Colonoscopy with intent to examine TI with Dr. Sherrell Puller as outpatient within a couple weeks    LOS: 3 days   Laban Emperor. Zehr  07/31/2019, 9:34 AM     Attending Physician Note   I have taken an interval history, reviewed the chart and examined the patient. I agree with the Advanced Practitioner's note, impression and recommendations.   Partial SBO improved but not resolved with intermittent mild abdominal pain and slight distention. Stay on full liquids. Begin IV Solumedrol 60 mg daily for presumed Crohn's ileitis. Continue IV antibiotics for now.  When she improves discharge on Prednisone.  Lucio Edward, MD [facility]

## 2019-07-31 NOTE — Care Management Important Message (Signed)
Important Message  Patient Details IM Letter given to Roque Lias SW Case Manager to present to the Patient Name: ADYLENE DLUGOSZ MRN: [id number] Date of Birth: March 16, 1946   Medicare Important Message Given:  Yes     Kerin Salen 07/31/2019, 10:12 AM

## 2019-07-31 NOTE — Progress Notes (Signed)
PROGRESS NOTE  Carrie Watkins IOX:[id number] DOB: 03-08-1946 DOA: 07/27/2019 PCP: [name], MD  HPI/Recap of past 24 hours: Carrie Watkins is a 74 y.o. female with medical history significant of SBO in Jan, DM2, HTN, GERD.  COVID in Dec.  Hospitalized in January for PSBO, follow-up with GI, CAT scan 07/03/2019 and office appointment 07/21/2019. No additional interventions made at follow-up with GI.  GI didn't think it was inflammatory bowel disease at that time.  Presents to the ED with similar abd pain and nausea with no vomiting.    ED Course: CT shows PSBO. TRH asked to admit.  07/31/19: Seen and examined.  Has abdominal discomfort intermittently with a full liquid diet.  Seen by GI, and was started on IV Solu-Medrol for presumed Crohn ileitis.  Assessment/Plan: Principal Problem:   Small bowel obstruction, partial (HCC) Active Problems:   Diabetes mellitus type 2, controlled, without complications (HCC)   Essential hypertension   Partial small bowel obstruction (HCC)  PSBO recurrent, improved but not resolved 1. Positive flatus and bowel movement 2. Currently on full liquid diet 3. She reported 2 bowel movement this morning with little bit of blood on toilet tissue. 4. Continue IV antibiotics empirically as recommended by GI and general surgery. 5. On IV abx rocephin and flagyl 3/2>> 6. Started on IV Solu-Medrol 50 mg daily by GI for presumed Crohn's ileitis.  Presumed crohn's ileitis IV Solu-Medrol 60 mg daily 3/5>> Switch to prednisone prior to discharge once symptomatology has improved  Recent diverticulitis Tentative colonoscopy in April by Dr. Willa Rough, her primary GI provider.  DM2 type 2 - 1. Hold metformin 2. A1C 6.3 on 06/19/19 3. continue ISS  HTN - BP at goal Cont home BP meds  Asymptomatic Pyuria - 1. No UTI symptoms 2. No SIRS 3. Will hold off on ABx for now, and check UCx  Chronic anxiety Continue home medications She is also on p.o. Xanax  chronically  Hypothyroidism Continue levothyroxine  Hyperlipidemia Continue Zocor  GERD Continue PPI  History of COVID-19 infection Positive COVID-19 test on 05/15/2019. Asymptomatic, no respiratory symptoms    DVT prophylaxis: Lovenox sq daily Code Status: Full Family Communication: will call family if ok with the patient.   Disposition Plan: pt is from home.  Anticipate dc to home once pSBO has resolved, GI and gen surgery sign off and symptomatology has improved.   Consults called: GI, General surgery       Objective: Vitals:   07/30/19 2050 07/31/19 0532 07/31/19 1100 07/31/19 1413  BP: (!) 156/78 (!) 148/63 140/64 (!) 152/66  Pulse: 67 (!) 57 74 61  Resp: 16 16 16 16   Temp: (!) 97.4 F (36.3 C) (!) 97.5 F (36.4 C) 98.4 F (36.9 C) 98 F (36.7 C)  TempSrc:    Oral  SpO2: 98% 96% 99% 94%  Weight:      Height:        Intake/Output Summary (Last 24 hours) at 07/31/2019 1455 Last data filed at 07/30/2019 1845 Gross per 24 hour  Intake 2672.49 ml  Output --  Net 2672.49 ml   Filed Weights   07/28/19 0758  Weight: 74 kg    Exam:  . General: 74 y.o. year-old female pleasant well-developed well-nourished no acute distress.  Alert oriented x3.   . Cardiovascular: Regular rate and rhythm no rubs or gallops.   Marland Kitchen Respiratory: Clear to auscultation no wheezes no rales.   . Abdomen: Mildly distended hypoactive bowel sounds present.  Musculoskeletal: No lower extremity edema bilaterally.   Marland Kitchen Psychiatry: Mood is appropriate for condition and setting.  Data Reviewed: CBC: Recent Labs  Lab 07/27/19 2213 07/28/19 0454 07/30/19 0624 07/31/19 0620  WBC 9.8 7.9 4.0 3.9*  NEUTROABS 7.4  --   --  2.2  HGB 13.2 12.5 10.8* 10.8*  HCT 40.6 38.7 33.5* 33.4*  MCV 92.5 94.2 92.3 92.3  PLT 200 194 151 732   Basic Metabolic Panel: Recent Labs  Lab 07/27/19 2213 07/28/19 0454 07/30/19 0624 07/31/19 0620  NA 140 136 139 138  K 3.9 4.0 3.3* 3.4*  CL 108 104  107 109  CO2 22 21* 20* 23  GLUCOSE 128* 154* 90 133*  BUN 17 12 8  <5*  CREATININE 0.66 0.52 0.60 0.56  CALCIUM 9.3 8.6* 8.4* 8.4*  MG  --   --  1.7 1.7  PHOS  --   --  3.7  --    GFR: Estimated Creatinine Clearance: 61.7 mL/min (by C-G formula based on SCr of 0.56 mg/dL). Liver Function Tests: Recent Labs  Lab 07/27/19 2213  AST 21  ALT 20  ALKPHOS 86  BILITOT 0.6  PROT 7.9  ALBUMIN 4.6   No results for input(s): LIPASE, AMYLASE in the last 168 hours. No results for input(s): AMMONIA in the last 168 hours. Coagulation Profile: No results for input(s): INR, PROTIME in the last 168 hours. Cardiac Enzymes: No results for input(s): CKTOTAL, CKMB, CKMBINDEX, TROPONINI in the last 168 hours. BNP (last 3 results) No results for input(s): PROBNP in the last 8760 hours. HbA1C: No results for input(s): HGBA1C in the last 72 hours. CBG: Recent Labs  Lab 07/30/19 1148 07/30/19 1707 07/30/19 2051 07/31/19 0757 07/31/19 1134  GLUCAP 144* 103* 154* 141* 136*   Lipid Profile: No results for input(s): CHOL, HDL, LDLCALC, TRIG, CHOLHDL, LDLDIRECT in the last 72 hours. Thyroid Function Tests: No results for input(s): TSH, T4TOTAL, FREET4, T3FREE, THYROIDAB in the last 72 hours. Anemia Panel: No results for input(s): VITAMINB12, FOLATE, FERRITIN, TIBC, IRON, RETICCTPCT in the last 72 hours. Urine analysis:    Component Value Date/Time   COLORURINE YELLOW 07/27/2019 2213   APPEARANCEUR HAZY (A) 07/27/2019 2213   LABSPEC 1.028 07/27/2019 2213   PHURINE 5.0 07/27/2019 2213   GLUCOSEU NEGATIVE 07/27/2019 2213   HGBUR NEGATIVE 07/27/2019 2213   BILIRUBINUR NEGATIVE 07/27/2019 2213   KETONESUR NEGATIVE 07/27/2019 2213   PROTEINUR NEGATIVE 07/27/2019 2213   UROBILINOGEN 0.2 12/18/2013 0936   NITRITE NEGATIVE 07/27/2019 2213   LEUKOCYTESUR LARGE (A) 07/27/2019 2213   Sepsis Labs: @LABRCNTIP (procalcitonin:4,lacticidven:4)  ) Recent Results (from the past 240 hour(s))  Urine  culture     Status: Abnormal   Collection Time: 07/27/19 11:38 PM   Specimen: Urine, Clean Catch  Result Value Ref Range Status   Specimen Description   Final    URINE, CLEAN CATCH Performed at [facility], [place] [address], [place], [place] [address]    Special Requests   Final    NONE Performed at [facility], [place] [address] [place], [place] [address]    Culture MULTIPLE SPECIES PRESENT, SUGGEST RECOLLECTION (A)  Final   Report Status 07/29/2019 FINAL  Final      Studies: No results found.  Scheduled Meds: . ALPRAZolam  0.5 mg Oral BID  . enoxaparin (LOVENOX) injection  40 mg Subcutaneous Daily  . insulin aspart  0-5 Units Subcutaneous QHS  . insulin aspart  0-9 Units Subcutaneous TID WC  .  irbesartan  150 mg Oral Daily  . levothyroxine  112 mcg Oral Q0600  . methylPREDNISolone (SOLU-MEDROL) injection  60 mg Intravenous Daily  . nortriptyline  10 mg Oral QHS  . pantoprazole  80 mg Oral Daily  . sertraline  50 mg Oral Daily  . simvastatin  10 mg Oral q1800    Continuous Infusions: . cefTRIAXone (ROCEPHIN)  IV 2 g (07/30/19 1854)  . dextrose 5 % and 0.9% NaCl 75 mL/hr at 07/31/19 0828  . metronidazole 500 mg (07/31/19 0841)     LOS: 3 days     Kayleen Memos, MD [facility] Pager [phone]  If 7PM-7AM, please contact night-coverage [email or web address] Password [facility] 07/31/2019, 2:55 PM

## 2019-08-01 LAB — BASIC METABOLIC PANEL
Anion gap: 7 (ref 5–15)
BUN: 6 mg/dL — ABNORMAL LOW (ref 8–23)
CO2: 25 mmol/L (ref 22–32)
Calcium: 8.9 mg/dL (ref 8.9–10.3)
Chloride: 108 mmol/L (ref 98–111)
Creatinine, Ser: 0.55 mg/dL (ref 0.44–1.00)
GFR calc Af Amer: >60 mL/min (ref >60)
GFR calc non Af Amer: >60 mL/min (ref >60)
Glucose, Bld: 131 mg/dL — ABNORMAL HIGH (ref 70–99)
Potassium: 3.2 mmol/L — ABNORMAL LOW (ref 3.5–5.1)
Sodium: 140 mmol/L (ref 135–145)

## 2019-08-01 LAB — MAGNESIUM: Magnesium: 1.7 mg/dL (ref 1.7–2.4)

## 2019-08-01 LAB — GLUCOSE, CAPILLARY
Glucose-Capillary: 104 mg/dL — ABNORMAL HIGH (ref 70–99)
Glucose-Capillary: 163 mg/dL — ABNORMAL HIGH (ref 70–99)
Glucose-Capillary: 199 mg/dL — ABNORMAL HIGH (ref 70–99)
Glucose-Capillary: 228 mg/dL — ABNORMAL HIGH (ref 70–99)

## 2019-08-01 MED ORDER — MAGNESIUM SULFATE 2 GM/50ML IV SOLN
2.0000 g | Freq: Once | INTRAVENOUS | Status: AC
  Administered 2019-08-01: 2 g via INTRAVENOUS
  Filled 2019-08-01: qty 50

## 2019-08-01 MED ORDER — POTASSIUM CHLORIDE CRYS ER 20 MEQ PO TBCR
40.0000 meq | EXTENDED_RELEASE_TABLET | Freq: Two times a day (BID) | ORAL | Status: AC
  Administered 2019-08-01 (×2): 40 meq via ORAL
  Filled 2019-08-01 (×2): qty 2

## 2019-08-01 MED ORDER — TRAZODONE HCL 50 MG PO TABS
50.0000 mg | ORAL_TABLET | Freq: Every evening | ORAL | Status: DC | PRN
  Administered 2019-08-01: 50 mg via ORAL
  Filled 2019-08-01: qty 1

## 2019-08-01 MED ORDER — MAGNESIUM SULFATE 4 GM/100ML IV SOLN
4.0000 g | Freq: Once | INTRAVENOUS | Status: DC
  Filled 2019-08-01: qty 100

## 2019-08-01 NOTE — Progress Notes (Signed)
    Progress Note   Subjective  Having BMs and passing flatus. Abdominal pain has resolved. Tolerating full liquids.    Objective  Vital signs in last 24 hours: Temp:  [97.5 F (36.4 C)-98.4 F (36.9 C)] 97.8 F (36.6 C) (03/06 0441) Pulse Rate:  [61-76] 62 (03/06 0441) Resp:  [16] 16 (03/06 0441) BP: (140-169)/(54-70) 141/54 (03/06 0441) SpO2:  [94 %-99 %] 96 % (03/06 0441) Last BM Date: 07/31/19  General: Alert, well-developed, in NAD Heart:  Regular rate and rhythm; no murmurs Chest: Clear to ascultation bilaterally Abdomen:  Soft, nontender and nondistended. Normal bowel sounds, without guarding, and without rebound.   Extremities:  Without edema. Neurologic:  Alert and  oriented x4; grossly normal neurologically. Psych:  Alert and cooperative. Normal mood and affect.  Intake/Output from previous day: 03/05 0701 - 03/06 0700 In: 350 [P.O.:150; IV Piggyback:200] Out: -  Intake/Output this shift: No intake/output data recorded.  Lab Results: Recent Labs    07/30/19 0624 07/31/19 0620  WBC 4.0 3.9*  HGB 10.8* 10.8*  HCT 33.5* 33.4*  PLT 151 153   BMET Recent Labs    07/30/19 0624 07/31/19 0620 08/01/19 0559  NA 139 138 140  K 3.3* 3.4* 3.2*  CL 107 109 108  CO2 20* 23 25  GLUCOSE 90 133* 131*  BUN 8 <5* 6*  CREATININE 0.60 0.56 0.55  CALCIUM 8.4* 8.4* 8.9      Assessment & Recommendations   1. Partial SBO, resolving. Suspected Crohn's ileitis. Symptoms improved today. Continue IV Solumedrol. Advance to soft diet as tolerated. She can resume full liquids if she is more comfortable. Anticipate discharge tomorrow if she continues to do well. Discontinue antibiotics at discharge. Prednisone 40 mg po qd at discharge until outpatient GI follow up. Schedule colonoscopy as outpatient.   2. Hypokalemia per primary service.    LOS: 4 days   Norberto Sorenson T. Fuller Plan MD 08/01/2019, 9:45 AM

## 2019-08-01 NOTE — Progress Notes (Signed)
PROGRESS NOTE  Carrie Watkins:[id number] DOB: Feb 14, 1946 DOA: 07/27/2019 PCP: [name], MD  HPI/Recap of past 24 hours: Carrie Watkins is a 74 y.o. female with medical history significant of SBO in Jan, DM2, HTN, GERD.  COVID in Dec.  Hospitalized in January for PSBO, follow-up with GI, CT scan 07/03/2019 and office appointment 07/21/2019. No additional interventions made at follow-up with GI.  Presents to the ED with similar abd pain nausea with no vomiting.    ED Course: CT shows PSBO. TRH asked to admit.  Care directed by GI.  Suspected crohn's ileitis for which she was started on IV solumedrol on 07/31/19.  Symptomatology improved.  08/01/19: Seen and examined feels better this am.  Denies abd pain or nausea.  On IV steroids.  Ok to advance her diet to soft per GI.   Assessment/Plan: Principal Problem:   Small bowel obstruction, partial (HCC) Active Problems:   Diabetes mellitus type 2, controlled, without complications (HCC)   Essential hypertension   Partial small bowel obstruction (HCC)  PSBO recurrent, resolving 1. Replete electrolytes with goal K+ 4.0 and Mg2+ 2.0 2. Start soft diet 3. Continue IV antibiotics empirically as recommended by GI and general surgery. 4. On IV abx rocephin and flagyl 3/2>> 5. Started on IV Solu-Medrol 50 mg daily by GI for presumed Crohn's ileitis.  Presumed crohn's ileitis IV Solu-Medrol 60 mg daily 3/5>> Switch to prednisone prior to discharge once symptomatology has improved prednisone 40 mg qd until sees GI specialist in the office  Recent diverticulitis Tentative colonoscopy in April by Dr. Willa Rough, her primary GI provider. C/w IV abx  DM2 type 2 - stable 1. Hold metformin 2. A1C 6.3 on 06/19/19 3. continue ISS  Hypokalemia K+ 3.2 Repleted Goal >4.0  Hypomagnesemia Mg2+ 1.7 Replete IV mag 2g once  HTN - BP at goal Cont home BP meds  Asymptomatic Pyuria - 1. No UTI symptoms 2. No SIRS 3. Will hold off on  ABx for now, and check UCx  Chronic anxiety Continue home medications She is also on p.o. Xanax chronically  Hypothyroidism Continue levothyroxine  Hyperlipidemia Continue Zocor  GERD Continue PPI  History of COVID-19 infection Positive COVID-19 test on 05/15/2019. Asymptomatic, no respiratory symptoms    DVT prophylaxis: Lovenox sq daily Code Status: Full Family Communication: Updated husband Fritz Pickerel 3/5   Disposition Plan: pt is from home.  Anticipate dc to home once pSBO has resolved and tolerating soft diet likely 3/7   Consults called: GI, General surgery       Objective: Vitals:   07/31/19 1100 07/31/19 1413 07/31/19 2140 08/01/19 0441  BP: 140/64 (!) 152/66 (!) 169/70 (!) 141/54  Pulse: 74 61 76 62  Resp: 16 16 16 16   Temp: 98.4 F (36.9 C) 98 F (36.7 C) (!) 97.5 F (36.4 C) 97.8 F (36.6 C)  TempSrc:  Oral Oral Oral  SpO2: 99% 94% 97% 96%  Weight:      Height:        Intake/Output Summary (Last 24 hours) at 08/01/2019 1353 Last data filed at 08/01/2019 0600 Gross per 24 hour  Intake 350 ml  Output --  Net 350 ml   Filed Weights   07/28/19 0758  Weight: 74 kg    Exam:  . General: 74 y.o. year-old female Pleasant WD WN NAD A&O x 3 . Cardiovascular: RRR no rubs or gallops   . Respiratory: CTA no wheezes or rales . Abdomen: NBS non tender, non distended .  Musculoskeletal: No LE edema bilaterally . Psychiatry: Mood is appropriate  Data Reviewed: CBC: Recent Labs  Lab 07/27/19 2213 07/28/19 0454 07/30/19 0624 07/31/19 0620  WBC 9.8 7.9 4.0 3.9*  NEUTROABS 7.4  --   --  2.2  HGB 13.2 12.5 10.8* 10.8*  HCT 40.6 38.7 33.5* 33.4*  MCV 92.5 94.2 92.3 92.3  PLT 200 194 151 381   Basic Metabolic Panel: Recent Labs  Lab 07/27/19 2213 07/28/19 0454 07/30/19 0624 07/31/19 0620 08/01/19 0559  NA 140 136 139 138 140  K 3.9 4.0 3.3* 3.4* 3.2*  CL 108 104 107 109 108  CO2 22 21* 20* 23 25  GLUCOSE 128* 154* 90 133* 131*  BUN 17 12 8   <5* 6*  CREATININE 0.66 0.52 0.60 0.56 0.55  CALCIUM 9.3 8.6* 8.4* 8.4* 8.9  MG  --   --  1.7 1.7 1.7  PHOS  --   --  3.7  --   --    GFR: Estimated Creatinine Clearance: 61.7 mL/min (by C-G formula based on SCr of 0.55 mg/dL). Liver Function Tests: Recent Labs  Lab 07/27/19 2213  AST 21  ALT 20  ALKPHOS 86  BILITOT 0.6  PROT 7.9  ALBUMIN 4.6   No results for input(s): LIPASE, AMYLASE in the last 168 hours. No results for input(s): AMMONIA in the last 168 hours. Coagulation Profile: No results for input(s): INR, PROTIME in the last 168 hours. Cardiac Enzymes: No results for input(s): CKTOTAL, CKMB, CKMBINDEX, TROPONINI in the last 168 hours. BNP (last 3 results) No results for input(s): PROBNP in the last 8760 hours. HbA1C: No results for input(s): HGBA1C in the last 72 hours. CBG: Recent Labs  Lab 07/31/19 1134 07/31/19 1613 07/31/19 2215 08/01/19 0740 08/01/19 1135  GLUCAP 136* 96 241* 104* 163*   Lipid Profile: No results for input(s): CHOL, HDL, LDLCALC, TRIG, CHOLHDL, LDLDIRECT in the last 72 hours. Thyroid Function Tests: No results for input(s): TSH, T4TOTAL, FREET4, T3FREE, THYROIDAB in the last 72 hours. Anemia Panel: No results for input(s): VITAMINB12, FOLATE, FERRITIN, TIBC, IRON, RETICCTPCT in the last 72 hours. Urine analysis:    Component Value Date/Time   COLORURINE YELLOW 07/27/2019 2213   APPEARANCEUR HAZY (A) 07/27/2019 2213   LABSPEC 1.028 07/27/2019 2213   PHURINE 5.0 07/27/2019 2213   GLUCOSEU NEGATIVE 07/27/2019 2213   HGBUR NEGATIVE 07/27/2019 2213   BILIRUBINUR NEGATIVE 07/27/2019 2213   KETONESUR NEGATIVE 07/27/2019 2213   PROTEINUR NEGATIVE 07/27/2019 2213   UROBILINOGEN 0.2 12/18/2013 0936   NITRITE NEGATIVE 07/27/2019 2213   LEUKOCYTESUR LARGE (A) 07/27/2019 2213   Sepsis Labs: @LABRCNTIP (procalcitonin:4,lacticidven:4)  ) Recent Results (from the past 240 hour(s))  Urine culture     Status: Abnormal   Collection Time:  07/27/19 11:38 PM   Specimen: Urine, Clean Catch  Result Value Ref Range Status   Specimen Description   Final    URINE, CLEAN CATCH Performed at [facility], [place] [address] [place], [place] [address]    Special Requests   Final    NONE Performed at [facility], [place] [address] [place], [place] [address]    Culture MULTIPLE SPECIES PRESENT, SUGGEST RECOLLECTION (A)  Final   Report Status 07/29/2019 FINAL  Final      Studies: No results found.  Scheduled Meds: . ALPRAZolam  0.5 mg Oral BID  . enoxaparin (LOVENOX) injection  40 mg Subcutaneous Daily  . insulin aspart  0-5 Units Subcutaneous QHS  . insulin  aspart  0-9 Units Subcutaneous TID WC  . irbesartan  150 mg Oral Daily  . levothyroxine  112 mcg Oral Q0600  . methylPREDNISolone (SOLU-MEDROL) injection  60 mg Intravenous Daily  . nortriptyline  10 mg Oral QHS  . pantoprazole  80 mg Oral Daily  . potassium chloride  40 mEq Oral BID  . sertraline  50 mg Oral Daily  . simvastatin  10 mg Oral q1800    Continuous Infusions: . cefTRIAXone (ROCEPHIN)  IV 2 g (07/31/19 1512)  . magnesium sulfate bolus IVPB    . metronidazole 500 mg (08/01/19 0813)     LOS: 4 days     Kayleen Memos, MD [facility] Pager [phone]  If 7PM-7AM, please contact night-coverage [email or web address] Password [email or web address] 08/01/2019, 1:53 PM

## 2019-08-02 LAB — BASIC METABOLIC PANEL
Anion gap: 7 (ref 5–15)
BUN: 12 mg/dL (ref 8–23)
CO2: 24 mmol/L (ref 22–32)
Calcium: 8.7 mg/dL — ABNORMAL LOW (ref 8.9–10.3)
Chloride: 109 mmol/L (ref 98–111)
Creatinine, Ser: 0.61 mg/dL (ref 0.44–1.00)
GFR calc Af Amer: >60 mL/min (ref >60)
GFR calc non Af Amer: >60 mL/min (ref >60)
Glucose, Bld: 117 mg/dL — ABNORMAL HIGH (ref 70–99)
Potassium: 3.9 mmol/L (ref 3.5–5.1)
Sodium: 140 mmol/L (ref 135–145)

## 2019-08-02 LAB — MAGNESIUM: Magnesium: 1.9 mg/dL (ref 1.7–2.4)

## 2019-08-02 LAB — GLUCOSE, CAPILLARY: Glucose-Capillary: 99 mg/dL (ref 70–99)

## 2019-08-02 MED ORDER — PREDNISONE 20 MG PO TABS: 40.0000 mg | ORAL_TABLET | Freq: Every day | ORAL | 0 refills | Status: AC

## 2019-08-02 MED ORDER — PREDNISONE 20 MG PO TABS
40.0000 mg | ORAL_TABLET | Freq: Every day | ORAL | Status: DC
  Administered 2019-08-02: 40 mg via ORAL
  Filled 2019-08-02: qty 2

## 2019-08-02 MED ORDER — POTASSIUM CHLORIDE CRYS ER 20 MEQ PO TBCR
40.0000 meq | EXTENDED_RELEASE_TABLET | Freq: Once | ORAL | Status: AC
  Administered 2019-08-02: 40 meq via ORAL
  Filled 2019-08-02: qty 2

## 2019-08-02 MED ORDER — POTASSIUM CHLORIDE CRYS ER 20 MEQ PO TBCR: 20.0000 meq | EXTENDED_RELEASE_TABLET | Freq: Every day | ORAL | 0 refills | Status: DC

## 2019-08-02 MED ORDER — POTASSIUM CHLORIDE CRYS ER 20 MEQ PO TBCR: 20.0000 meq | EXTENDED_RELEASE_TABLET | Freq: Every day | ORAL | Status: DC

## 2019-08-02 NOTE — Progress Notes (Addendum)
          Daily Rounding Note  08/02/2019, 10:46 AM  LOS: 5 days   SUBJECTIVE:   Chief complaint: Partial small bowel obstruction.  Suspected Crohn's ileitis. Patient has had bowel movements yesterday and today.  No blood, texture is soft, color is brown.  Tolerating full liquids, soft diet.  No bloating, no nausea, no abdominal pain.  Feels ready to go home.  OBJECTIVE:         Vital signs in last 24 hours:    Temp:  [98.1 F (36.7 C)-98.6 F (37 C)] 98.1 F (36.7 C) (03/07 0539) Pulse Rate:  [67-73] 67 (03/07 0539) Resp:  [16-17] 16 (03/07 0539) BP: (140-158)/(67-85) 158/67 (03/07 0539) SpO2:  [99 %-100 %] 100 % (03/07 0539) Last BM Date: 07/31/19 Filed Weights   07/28/19 0758  Weight: 74 kg   General: Looks well.  No distress. Heart: RRR Chest: Lear bilaterally. Abdomen: Soft without tenderness.  Active bowel sounds.  No distention. Extremities: No peripheral edema Neuro/Psych: Alert, pleasant, cooperative.  Intake/Output from previous day: 03/06 0701 - 03/07 0700 In: 546.3 [P.O.:240; IV Piggyback:306.3] Out: -   Intake/Output this shift: No intake/output data recorded.  Lab Results: Recent Labs    07/31/19 0620  WBC 3.9*  HGB 10.8*  HCT 33.4*  PLT 153   BMET Recent Labs    07/31/19 0620 08/01/19 0559 08/02/19 0542  NA 138 140 140  K 3.4* 3.2* 3.9  CL 109 108 109  CO2 23 25 24   GLUCOSE 133* 131* 117*  BUN <5* 6* 12  CREATININE 0.56 0.55 0.61  CALCIUM 8.4* 8.9 8.7*   LFT No results for input(s): PROT, ALBUMIN, AST, ALT, ALKPHOS, BILITOT, BILIDIR, IBILI in the last 72 hours. PT/INR No results for input(s): LABPROT, INR in the last 72 hours. Hepatitis Panel No results for input(s): HEPBSAG, HCVAB, HEPAIGM, HEPBIGM in the last 72 hours.  Studies/Results: No results found.  ASSESMENT:   *   Partial SBO.  Suspected Crohn's ileitis. IV Solu-Medrol in place.  *    Hypokalemia, resolved.  *     Normocytic anemia.   PLAN   *   Okay for discharge from GI standpoint.  Should start prednisone 40 mg daily, decrease to 30 mg daily after 2 weeks.  GI office follow up in about 1 week.  Dr. Fuller Plan plans outpatient colonoscopy in 2-3 weeks, patient will be notified of all details for follow-up. Note that her pharmacy, [facility], is not open on Sunday.  She received prednisone 40 mg today.   Follow soft diet. She is going to need to have follow-up of a bmet to recheck potassium within the next week.    Azucena Freed  08/02/2019, 10:46 AM Phone [phone]     Attending Physician Note   I have taken an interval history, reviewed the chart and examined the patient. I agree with the Advanced Practitioner's note, impression and recommendations.   Lucio Edward, MD [facility]

## 2019-08-02 NOTE — Discharge Summary (Addendum)
Discharge Summary  Carrie Watkins AOZ:[id number] DOB: April 18, 1946  PCP: [name], MD  Admit date: 07/27/2019 Discharge date: 08/02/2019  Time spent: 35 minutes.  Recommendations for Outpatient Follow-up:  1. Follow-up with your GI specialist in 1 week. 2. Follow-up with your primary care provider. 3. Take your medications as prescribed.  Discharge Diagnoses:  Active Hospital Problems   Diagnosis Date Noted  . Small bowel obstruction, partial ([place]) 06/19/2019  . Partial small bowel obstruction ([place]) 07/28/2019  . Diabetes mellitus type 2, controlled, without complications ([place]) 96/29/5284  . Essential hypertension 01/13/2014    Resolved Hospital Problems  No resolved problems to display.    Discharge Condition: Stable.  Diet recommendation: Resume previous diet.  Vitals:   08/01/19 2023 08/02/19 0539  BP: 140/85 (!) 158/67  Pulse: 73 67  Resp: 17 16  Temp: 98.6 F (37 C) 98.1 F (36.7 C)  SpO2: 99% 100%    History of present illness:  PASCHA FOGAL a 74 y.o.femalewith medical history significant ofSBO in Jan, DM2, HTN, GERD.  COVID in Dec.  Hospitalized in [place], follow-up with GI, CT scan 07/03/2019 and office appointment 07/21/2019. No additional interventions made at follow-up with GI.  Presents to the ED with similar abd pain nausea with no vomiting.  ED Course:CT shows PSBO. TRH asked to admit.  Care directed by GI.  Suspected crohn's ileitis for which she was started on IV solumedrol on 07/31/19.  Symptomatology improved.   08/02/19: Seen and examined.  Denies abdominal pain and nausea.    Hospital Course:  Principal Problem:   Small bowel obstruction, partial (HCC) Active Problems:   Diabetes mellitus type 2, controlled, without complications (HCC)   Essential hypertension   Partial small bowel obstruction (HCC)       PSBO recurrent, resolving 1. Replete electrolytes with goal K+ 4.0 and Mg2+ 2.0 2. Tolerated Soft diet  well. 3. IV abx rocephin and flagyl 3/2 >> 08/02/19, completed course. 4. Completed IV Solu-Medrol 60 mg daily by GI for presumed Crohn's ileitis. 5. Started prednisone 40 mg daily for presumed Crohn's ileitis.  Presumed crohn's ileitis IV Solu-Medrol 60 mg daily 07/31/19>> 08/02/19. Switched to prednisone prior to discharge once symptomatology has   improved prednisone 40 mg qd until sees GI specialist in the office  Recent diverticulitis Tentative colonoscopy in April by Dr. Willa Rough, her primary GI provider. Completed IV abx        DM2 type 2 - stable 1. Hold metformin 2. A1C 6.3 on 06/19/19 3. continue ISS  Resolved Hypokalemia K+ 3.2>> 3.9  Hypomagnesemia Mg2+ 1.7 >1.9        HTN -       BP at goal       Cont home BP meds        Asymptomatic Pyuria - 1. No UTI symptoms 2. No SIRS 3. Will hold off on ABx for now, and check UCx  Chronic anxiety Continue home medications She is also on p.o. Xanax chronically  Hypothyroidism Continue levothyroxine  Hyperlipidemia Continue Zocor  GERD Continue PPI  History of COVID-19 infection Positive COVID-19 test on 05/15/2019. Asymptomatic, no respiratory symptoms     Code Status:Full   Consults called:GI, General surgery    Procedures:  None  Consultations:  GI  Discharge Exam: BP (!) 158/67 (BP Location: Right Arm)   Pulse 67   Temp 98.1 F (36.7 C) (Oral)   Resp 16   Ht 5' 4.02" (1.626 m)   Wt 74  kg   SpO2 100%   BMI 27.99 kg/m  . General: 74 y.o. year-old female well developed well nourished in no acute distress.  Alert and oriented x3. . Cardiovascular: Regular rate and rhythm with no rubs or gallops.  No thyromegaly or JVD noted.   Marland Kitchen Respiratory: Clear to auscultation with no wheezes or rales. Good inspiratory effort. . Abdomen: Soft nontender nondistended with normal bowel sounds x4 quadrants. . Musculoskeletal: No lower extremity edema. 2/4 pulses in all 4  extremities. Marland Kitchen Psychiatry: Mood is appropriate for condition and setting  Discharge Instructions You were cared for by a hospitalist during your hospital stay. If you have any questions about your discharge medications or the care you received while you were in the hospital after you are discharged, you can call the unit and asked to speak with the hospitalist on call if the hospitalist that took care of you is not available. Once you are discharged, your primary care physician will handle any further medical issues. Please note that NO REFILLS for any discharge medications will be authorized once you are discharged, as it is imperative that you return to your primary care physician (or establish a relationship with a primary care physician if you do not have one) for your aftercare needs so that they can reassess your need for medications and monitor your lab values.   Allergies as of 08/02/2019      Reactions   Ciprofloxacin Other (See Comments)   Was told could not take this per  Urologist Was told could not take this per  Urologist   Prozac [fluoxetine Hcl]    Hydralazine Itching, Rash      Medication List    TAKE these medications   acetaminophen 325 MG tablet Commonly known as: TYLENOL Take 650 mg by mouth every 6 (six) hours as needed for mild pain or headache.   ALPRAZolam 0.5 MG tablet Commonly known as: XANAX Take 0.5 mg by mouth 2 (two) times daily.   cholecalciferol 25 MCG (1000 UNIT) tablet Commonly known as: VITAMIN D3 Take 1,000 Units by mouth daily.   dicyclomine 10 MG capsule Commonly known as: BENTYL Take 1 capsule (10 mg total) by mouth 3 (three) times daily as needed for spasms.   levothyroxine 112 MCG tablet Commonly known as: SYNTHROID Take 112 mcg by mouth every morning.   LORazepam 0.5 MG tablet Commonly known as: ATIVAN Take 0.5 mg by mouth every 8 (eight) hours as needed for anxiety.   meclizine 25 MG tablet Commonly known as: ANTIVERT Take 25 mg  by mouth 2 (two) times daily as needed for dizziness.   metFORMIN 500 MG 24 hr tablet Commonly known as: GLUCOPHAGE-XR Take 1,000 mg by mouth daily.   nortriptyline 10 MG capsule Commonly known as: PAMELOR Take 10 mg by mouth at bedtime.   omeprazole 40 MG capsule Commonly known as: PRILOSEC Take 40 mg by mouth daily.   potassium chloride SA 20 MEQ tablet Commonly known as: KLOR-CON Take 1 tablet (20 mEq total) by mouth daily for 5 days.   predniSONE 20 MG tablet Commonly known as: DELTASONE Take 2 tablets (40 mg total) by mouth daily with breakfast for 14 days. Start taking on: August 03, 2019   promethazine 12.5 MG tablet Commonly known as: PHENERGAN Take 12.5 mg by mouth every 6 (six) hours as needed for nausea/vomiting.   sertraline 50 MG tablet Commonly known as: ZOLOFT Take 50 mg by mouth daily.   simvastatin 10 MG tablet Commonly  known as: ZOCOR Take 10 mg by mouth daily.   valsartan 160 MG tablet Commonly known as: DIOVAN Take 160 mg by mouth daily.   VITAMIN C PO Take 1 tablet by mouth daily.   ZINC PO Take 1 tablet by mouth daily.      Allergies  Allergen Reactions  . Ciprofloxacin Other (See Comments)    Was told could not take this per  Urologist Was told could not take this per  Urologist   . Prozac [Fluoxetine Hcl]   . Hydralazine Itching and Rash   Follow-up Information    [name], MD. Call in 1 day(s).   Specialty: Family Medicine Why: Please call for a post hospital follow-up appointment. Contact information: [address] [place] [address] [phone]        Ramsay, Juanito Doom, MD. Call in 1 day(s).   Specialties: Internal Medicine, Gastroenterology Why: Please call for a post hospital follow-up appointment. Contact information: [address] [place] [address] [phone]            The results of significant diagnostics from this hospitalization (including imaging, microbiology,  ancillary and laboratory) are listed below for reference.    Significant Diagnostic Studies: CT Abdomen Pelvis W Contrast  Result Date: 07/28/2019 CLINICAL DATA:  Abdominal pain and nausea, history of recent small bowel obstruction EXAM: CT ABDOMEN AND PELVIS WITH CONTRAST TECHNIQUE: Multidetector CT imaging of the abdomen and pelvis was performed using the standard protocol following bolus administration of intravenous contrast. CONTRAST:  128m OMNIPAQUE IOHEXOL 300 MG/ML  SOLN COMPARISON:  06/19/2019 FINDINGS: Lower chest: Mild scarring is noted in the bases bilaterally. Hepatobiliary: Fatty infiltration of the liver is seen with multiple scattered cysts. The gallbladder has been surgically removed. Pancreas: Unremarkable. No pancreatic ductal dilatation or surrounding inflammatory changes. Spleen: Normal in size without focal abnormality. Adrenals/Urinary Tract: Right adrenal gland again demonstrates a small adenoma stable from the prior exam. The left adrenal gland is within normal limits. Kidneys demonstrate a normal enhancement pattern. Small parapelvic cysts are noted bilaterally. No renal calculi or obstructive changes are noted. Bladder is partially distended. Stomach/Bowel: Scattered diverticular changes noted without evidence of diverticulitis. The appendix is within normal limits. Distal ileum shows wall thickening with fatty deposition and fecalization of bowel contents. The small bowel proximal to this is dilated as well similar to that seen on the prior exam. The jejunum appears within normal limits. No gastric abnormality is seen. Vascular/Lymphatic: Aortic atherosclerosis. No enlarged abdominal or pelvic lymph nodes. Reproductive: Uterus is within normal limits. Left ovarian cyst is again noted measuring 3.9 cm relatively stable from the prior exam. Other: No herniation is seen. Minimal free fluid is noted similar to that seen on the prior exam. Musculoskeletal: No acute or significant osseous  findings. IMPRESSION: Chronic changes in the distal ileum with fatty deposition within the thickened walls and fecalization of bowel contents consistent with delayed transit. These changes are consistent with a partial small bowel obstruction similar to that seen on the prior exam. 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 No changes of fistulization are identified. The remainder of the exam is stable from the prior study. Electronically Signed   By: MInez CatalinaM.D.   On: 07/28/2019 01:14    Microbiology: Recent Results (from the past 240 hour(s))  Urine culture     Status: Abnormal   Collection Time: 07/27/19 11:38 PM   Specimen: Urine, Clean Catch  Result Value Ref Range Status   Specimen Description   Final    URINE, CLEAN CATCH Performed at [facility], [place] [address] [place], [place] [address]    Special Requests   Final    NONE Performed at [facility], [place] [address] [place], [place] [address]    Culture MULTIPLE SPECIES PRESENT, SUGGEST RECOLLECTION (A)  Final   Report Status 07/29/2019 FINAL  Final     Labs: Basic Metabolic Panel: Recent Labs  Lab 07/28/19 0454 07/30/19 0624 07/31/19 0620 08/01/19 0559 08/02/19 0542 08/02/19 0557  NA 136 139 138 140 140  --   K 4.0 3.3* 3.4* 3.2* 3.9  --   CL 104 107 109 108 109  --   CO2 21* 20* 23 25 24   --   GLUCOSE 154* 90 133* 131* 117*  --   BUN 12 8 <5* 6* 12  --   CREATININE 0.52 0.60 0.56 0.55 0.61  --   CALCIUM 8.6* 8.4* 8.4* 8.9 8.7*  --   MG  --  1.7 1.7 1.7  --  1.9  PHOS  --  3.7  --   --   --   --    Liver Function Tests: Recent Labs  Lab 07/27/19 2213  AST 21  ALT 20  ALKPHOS 86  BILITOT 0.6  PROT 7.9  ALBUMIN 4.6   No results for input(s): LIPASE, AMYLASE in the last 168 hours. No results for input(s): AMMONIA in the last 168 hours. CBC: Recent Labs  Lab 07/27/19 2213 07/28/19 0454 07/30/19 0624 07/31/19 0620  WBC  9.8 7.9 4.0 3.9*  NEUTROABS 7.4  --   --  2.2  HGB 13.2 12.5 10.8* 10.8*  HCT 40.6 38.7 33.5* 33.4*  MCV 92.5 94.2 92.3 92.3  PLT 200 194 151 153   Cardiac Enzymes: No results for input(s): CKTOTAL, CKMB, CKMBINDEX, TROPONINI in the last 168 hours. BNP: BNP (last 3 results) No results for input(s): BNP in the last 8760 hours.  ProBNP (last 3 results) No results for input(s): PROBNP in the last 8760 hours.  CBG: Recent Labs  Lab 08/01/19 0740 08/01/19 1135 08/01/19 1630 08/01/19 2030 08/02/19 0752  GLUCAP 104* 163* 199* 228* 99       Signed:  Kayleen Memos, MD [facility] 08/02/2019, 12:48 PM

## 2019-08-02 NOTE — Discharge Instructions (Signed)
Abdominal Pain, Adult Many things can cause belly (abdominal) pain. Most times, belly pain is not dangerous. Many cases of belly pain can be watched and treated at home. Sometimes, though, belly pain is serious. Your doctor will try to find the cause of your belly pain. Follow these instructions at home:  Medicines  Take over-the-counter and prescription medicines only as told by your doctor.  Do not take medicines that help you poop (laxatives) unless told by your doctor. General instructions  Watch your belly pain for any changes.  Drink enough fluid to keep your pee (urine) pale yellow.  Keep all follow-up visits as told by your doctor. This is important. Contact a doctor if:  Your belly pain changes or gets worse.  You are not hungry, or you lose weight without trying.  You are having trouble pooping (constipated) or have watery poop (diarrhea) for more than 2-3 days.  You have pain when you pee or poop.  Your belly pain wakes you up at night.  Your pain gets worse with meals, after eating, or with certain foods.  You are vomiting and cannot keep anything down.  You have a fever.  You have blood in your pee. Get help right away if:  Your pain does not go away as soon as your doctor says it should.  You cannot stop vomiting.  Your pain is only in areas of your belly, such as the right side or the left lower part of the belly.  You have bloody or black poop, or poop that looks like tar.  You have very bad pain, cramping, or bloating in your belly.  You have signs of not having enough fluid or water in your body (dehydration), such as: ? Dark pee, very little pee, or no pee. ? Cracked lips. ? Dry mouth. ? Sunken eyes. ? Sleepiness. ? Weakness.  You have trouble breathing or chest pain. Summary  Many cases of belly pain can be watched and treated at home.  Watch your belly pain for any changes.  Take over-the-counter and prescription medicines only as  told by your doctor.  Contact a doctor if your belly pain changes or gets worse.  Get help right away if you have very bad pain, cramping, or bloating in your belly. This information is not intended to replace advice given to you by your health care provider. Make sure you discuss any questions you have with your health care provider. Document Revised: 09/22/2018 Document Reviewed: 09/22/2018 Elsevier Patient Education  [place].   Bowel Obstruction A bowel obstruction means that something is blocking the small or large bowel. The bowel is also called the intestine. It is the long tube that connects the stomach to the opening of the butt (anus). When something blocks the bowel, food and fluids cannot pass through like normal. This condition needs to be treated. Treatment depends on the cause of the problem and how bad the problem is. What are the causes? Common causes of this condition include:  Scar tissue (adhesions) from past surgery or from high-energy X-rays (radiation).  Recent surgery in the belly. This affects how food moves in the bowel.  Some diseases, such as: ? Irritation of the lining of the digestive tract (Crohn's disease). ? Irritation of small pouches in the bowel (diverticulitis).  Growths or tumors.  A bulging organ (hernia).  Twisting of the bowel (volvulus).  A foreign body.  Slipping of a part of the bowel into another part (intussusception). What are  the signs or symptoms? Symptoms of this condition include:  Pain in the belly.  Feeling sick to your stomach (nauseous).  Throwing up (vomiting).  Bloating in the belly.  Being unable to pass gas.  Trouble pooping (constipation).  Watery poop (diarrhea).  A lot of belching. How is this diagnosed? This condition may be diagnosed based on:  A physical exam.  Medical history.  Imaging tests, such as X-ray or CT scan.  Blood tests.  Urine tests. How is this treated? Treatment for  this condition may include:  Fluids and pain medicines that are given through an IV tube. Your doctor may tell you not to eat or drink if you feel sick to your stomach and are throwing up.  Eating a clear liquid diet for a few days.  Putting a small tube (nasogastric tube) into the stomach. This will help with pain, discomfort, and nausea by removing blocked air and fluids from the stomach.  Surgery. This may be needed if other treatments do not work. Follow these instructions at home: Medicines  Take over-the-counter and prescription medicines only as told by your doctor.  If you were prescribed an antibiotic medicine, take it as told by your doctor. Do not stop taking the antibiotic even if you start to feel better. General instructions  Follow your diet as told by your doctor. You may need to: ? Only drink clear liquids until you start to get better. ? Avoid solid foods.  Return to your normal activities as told by your doctor. Ask your doctor what activities are safe for you.  Do not sit for a long time without moving. Get up to take short walks every 1-2 hours. This is important. Ask for help if you feel weak or unsteady.  Keep all follow-up visits as told by your doctor. This is important. How is this prevented? After having a bowel obstruction, you may be more likely to have another. You can do some things to stop it from happening again.  If you have a long-term (chronic) disease, contact your doctor if you see changes or problems.  Take steps to prevent or treat trouble pooping. Your doctor may ask that you: ? Drink enough fluid to keep your pee (urine) pale yellow. ? Take over-the-counter or prescription medicines. ? Eat foods that are high in fiber. These include beans, whole grains, and fresh fruits and vegetables. ? Limit foods that are high in fat and sugar. These include fried or sweet foods.  Stay active. Ask your doctor which exercises are safe for you.  Avoid  stress.  Eat three small meals and three small snacks each day.  Work with a Publishing rights manager (dietitian) to make a meal plan that works for you.  Do not use any products that contain nicotine or tobacco, such as cigarettes and e-cigarettes. If you need help quitting, ask your doctor. Contact a doctor if:  You have a fever.  You have chills. Get help right away if:  You have pain or cramps that get worse.  You throw up blood.  You are sick to your stomach.  You cannot stop throwing up.  You cannot drink fluids.  You feel mixed up (confused).  You feel very thirsty (dehydrated).  Your belly gets more bloated.  You feel weak or you pass out (faint). Summary  A bowel obstruction means that something is blocking the small or large bowel.  Treatment may include IV fluids and pain medicine. You may also have a clear  liquid diet, a small tube in your stomach, or surgery.  Drink clear liquids and avoid solid foods until you get better. This information is not intended to replace advice given to you by your health care provider. Make sure you discuss any questions you have with your health care provider. Document Revised: 09/25/2017 Document Reviewed: 09/25/2017 Elsevier Patient Education  [place].

## 2019-08-03 ENCOUNTER — Encounter: Payer: Self-pay | Admitting: Physician Assistant

## 2019-08-03 ENCOUNTER — Telehealth: Payer: Self-pay

## 2019-08-03 NOTE — Telephone Encounter (Signed)
Patient informed and verbalized understanding

## 2019-08-03 NOTE — Telephone Encounter (Signed)
I have scheduled Carrie Watkins to see Ellouise Newer PA-C on 08/11/2019 at 11:00AM. I will have our staff mail her NP information. I confirmed her address. She is set up to have a colonoscopy in the [place] with Dr Fuller Plan on 08/19/2019 at 1:30pm. We will give her the instructions when she comes to her office visit. She had COVID 05/22/2019 so we will let her know if she has to be COVID tested prior to her procedure when she comes to her office visit.     She requested that I let Dr Fuller Plan know that today she has had 6-8 episodes of diarrhea, no blood, no fever. She is taking her prednisone and potassium given to her upon hospital discharge and she is staying hydrated. Please advise Dr Fuller Plan if there is anything else you would like her to do Sir, thank you.

## 2019-08-03 NOTE — Telephone Encounter (Signed)
Soft, low residue, lactose free diet with no raw fruits or raw vegetables until symptoms improved. Plenty of juice, water until diarrhea improved.

## 2019-08-03 NOTE — Telephone Encounter (Signed)
-----   Message from Ladene Artist, MD sent at 08/02/2019  9:47 AM EST ----- Recent hospital inpatient with suspected Crohn's ileitis that need an APP appt in about 1 week and appt for colonoscopy in [place] with me in about 2-3 weeks. Please help her schedule both. Thx

## 2019-08-03 NOTE — Telephone Encounter (Signed)
-----   Message from Ladene Artist, MD sent at 08/02/2019  9:47 AM EST ----- Recent hospital inpatient with suspected Crohn's ileitis that need an APP appt in about 1 week and appt for colonoscopy in [place] with me in about 2-3 weeks. Please help her schedule both. Thx

## 2019-08-06 ENCOUNTER — Encounter (HOSPITAL_COMMUNITY): Payer: Self-pay

## 2019-08-06 ENCOUNTER — Other Ambulatory Visit: Payer: Self-pay

## 2019-08-06 ENCOUNTER — Emergency Department (HOSPITAL_COMMUNITY): Payer: Medicare Other

## 2019-08-06 ENCOUNTER — Telehealth: Payer: Self-pay | Admitting: Gastroenterology

## 2019-08-06 ENCOUNTER — Emergency Department (HOSPITAL_COMMUNITY)
Admission: EM | Admit: 2019-08-06 | Discharge: 2019-08-06 | Disposition: A | Discharge status: Home / Self Care | Payer: Medicare Other | Attending: Emergency Medicine | Admitting: Emergency Medicine

## 2019-08-06 DIAGNOSIS — Z79899 Other long term (current) drug therapy: Secondary | ICD-10-CM | POA: Diagnosis not present

## 2019-08-06 DIAGNOSIS — E039 Hypothyroidism, unspecified: Secondary | ICD-10-CM | POA: Insufficient documentation

## 2019-08-06 DIAGNOSIS — Z7984 Long term (current) use of oral hypoglycemic drugs: Secondary | ICD-10-CM | POA: Diagnosis not present

## 2019-08-06 DIAGNOSIS — R197 Diarrhea, unspecified: Secondary | ICD-10-CM | POA: Diagnosis not present

## 2019-08-06 DIAGNOSIS — R6883 Chills (without fever): Secondary | ICD-10-CM | POA: Insufficient documentation

## 2019-08-06 DIAGNOSIS — Z8616 Personal history of COVID-19: Secondary | ICD-10-CM | POA: Diagnosis not present

## 2019-08-06 DIAGNOSIS — R1030 Lower abdominal pain, unspecified: Secondary | ICD-10-CM | POA: Insufficient documentation

## 2019-08-06 DIAGNOSIS — I10 Essential (primary) hypertension: Secondary | ICD-10-CM | POA: Diagnosis not present

## 2019-08-06 DIAGNOSIS — E119 Type 2 diabetes mellitus without complications: Secondary | ICD-10-CM | POA: Insufficient documentation

## 2019-08-06 LAB — URINALYSIS, ROUTINE W REFLEX MICROSCOPIC
Bacteria, UA: NONE SEEN
Bilirubin Urine: NEGATIVE
Glucose, UA: NEGATIVE mg/dL
Hgb urine dipstick: NEGATIVE
Ketones, ur: NEGATIVE mg/dL
Nitrite: NEGATIVE
Protein, ur: NEGATIVE mg/dL
Specific Gravity, Urine: 1.006 (ref 1.005–1.030)
pH: 6 (ref 5.0–8.0)

## 2019-08-06 LAB — LIPASE, BLOOD: Lipase: 32 U/L (ref 11–51)

## 2019-08-06 LAB — COMPREHENSIVE METABOLIC PANEL
ALT: 32 U/L (ref 0–44)
AST: 26 U/L (ref 15–41)
Albumin: 4.7 g/dL (ref 3.5–5.0)
Alkaline Phosphatase: 88 U/L (ref 38–126)
Anion gap: 13 (ref 5–15)
BUN: 23 mg/dL (ref 8–23)
CO2: 22 mmol/L (ref 22–32)
Calcium: 9.9 mg/dL (ref 8.9–10.3)
Chloride: 100 mmol/L (ref 98–111)
Creatinine, Ser: 0.79 mg/dL (ref 0.44–1.00)
GFR calc Af Amer: >60 mL/min (ref >60)
GFR calc non Af Amer: >60 mL/min (ref >60)
Glucose, Bld: 150 mg/dL — ABNORMAL HIGH (ref 70–99)
Potassium: 4.7 mmol/L (ref 3.5–5.1)
Sodium: 135 mmol/L (ref 135–145)
Total Bilirubin: 0.6 mg/dL (ref 0.3–1.2)
Total Protein: 8.1 g/dL (ref 6.5–8.1)

## 2019-08-06 LAB — CBC
HCT: 42.8 % (ref 36.0–46.0)
Hemoglobin: 14 g/dL (ref 12.0–15.0)
MCH: 29.5 pg (ref 26.0–34.0)
MCHC: 32.7 g/dL (ref 30.0–36.0)
MCV: 90.1 fL (ref 80.0–100.0)
Platelets: 330 10*3/uL (ref 150–400)
RBC: 4.75 MIL/uL (ref 3.87–5.11)
RDW: 12.5 % (ref 11.5–15.5)
WBC: 11.2 10*3/uL — ABNORMAL HIGH (ref 4.0–10.5)
nRBC: 0 % (ref 0.0–0.2)

## 2019-08-06 MED ORDER — SODIUM CHLORIDE 0.9 % IV BOLUS
500.0000 mL | Freq: Once | INTRAVENOUS | Status: AC
  Administered 2019-08-06: 500 mL via INTRAVENOUS

## 2019-08-06 MED ORDER — SODIUM CHLORIDE 0.9% FLUSH
3.0000 mL | Freq: Once | INTRAVENOUS | Status: AC
  Administered 2019-08-06: 3 mL via INTRAVENOUS

## 2019-08-06 MED ORDER — ONDANSETRON 4 MG PO TBDP: 4.0000 mg | ORAL_TABLET | Freq: Three times a day (TID) | ORAL | 0 refills | Status: DC | PRN

## 2019-08-06 NOTE — Telephone Encounter (Signed)
Patient has had diarrhea all AM, now stopped. She is having chills and shaky , SOB , she is going to take a xanax as she feels that the anxiety may be causing her SOB. Her temp is 97.1. She finished her potassium this AM. She said she is staying hydrated. Please advise, thank you Sir.

## 2019-08-06 NOTE — Telephone Encounter (Signed)
Left message for pt to call back  °

## 2019-08-06 NOTE — Telephone Encounter (Signed)
Spoke with patient and she is on her way to the ED. See additional message from 08/06/2019.

## 2019-08-06 NOTE — Telephone Encounter (Signed)
Pt states she was discharged from the hospital on Sunday. Reports she is having pain in her RLQ. States she is having chills, she is shaking and having cold sweats. Pt states she is SOB, she cannot sit down. Pt reports she took an antianxiety pill but it has not helped. She is very anxious and is hurting, wants to know what she needs to do. Please advise.

## 2019-08-06 NOTE — Telephone Encounter (Signed)
Return now to [facility] ED for evaluation.

## 2019-08-06 NOTE — ED Triage Notes (Signed)
Patient reports that she was discharged from the hospital 4 days ago in which she had a bowel obstruction.  Patient c/o abdominal pain, diarrhea, and chills since being discharged.

## 2019-08-06 NOTE — Telephone Encounter (Signed)
Spoke with pt and she is aware.

## 2019-08-06 NOTE — ED Provider Notes (Signed)
[facility] DEPT Provider Note   CSN: [id number] Arrival date & time: 08/06/19  1620     History Chief Complaint  Patient presents with  . Abdominal Pain  . Chills  . Diarrhea    Carrie Watkins is a 74 y.o. female history of diabetes, GERD, high cholesterol, hypertension, SBO.  Patient presents today for concern of abdominal pain, diarrhea and chills.  Onset of symptoms 3 days ago shortly after leaving hospital after admission for partial small bowel obstruction.  She reports that she is feeling well upon discharge however the next day she developed a mild lower abdominal cramping, intermittent nonradiating slightly worsened with diarrhea improved after diarrhea stops.  She describes her diarrhea as small amounts of light brown stool, denies melena or frank blood.  She reports feeling cold and chills but denies any fever at home.  She denies measured fever, headache, chest pain/shortness of breath, cough/hemoptysis, vomiting, dysuria/hematuria, injury, melena, hematochezia or any additional concerns.  HPI     Past Medical History:  Diagnosis Date  . Anemia   . Diabetes mellitus without complication ([place])   . GERD (gastroesophageal reflux disease)   . GI bleed   . High cholesterol   . Hypertension   . Thyroid disease     Patient Active Problem List   Diagnosis Date Noted  . Partial small bowel obstruction ([place]) 07/28/2019  . COVID-19 virus infection 06/20/2019  . Small bowel obstruction, partial ([place]) 06/19/2019  . Obstruction of bowel ([place]) 06/19/2019  . Abnormal computed tomography of cecum and terminal ileum 06/19/2019  . Abdominal pain 06/19/2019  . Nausea and vomiting 06/19/2019  . Mixed hyperlipidemia 02/04/2015  . Vitamin D deficiency 04/01/2014  . Diabetes mellitus type 2, controlled, without complications ([place]) 40/98/1191  . Essential hypertension 01/13/2014  . Hypothyroidism 01/13/2014    History reviewed. No pertinent  surgical history.   OB History   No obstetric history on file.     Family History  Problem Relation Age of Onset  . Heart failure Mother     Social History   Tobacco Use  . Smoking status: Never Smoker  . Smokeless tobacco: Never Used  Substance Use Topics  . Alcohol use: No  . Drug use: Never    Home Medications Prior to Admission medications   Medication Sig Start Date End Date Taking? Authorizing Provider  acetaminophen (TYLENOL) 325 MG tablet Take 650 mg by mouth every 6 (six) hours as needed for mild pain or headache.   Yes [provider]  ALPRAZolam Duanne Moron) 0.5 MG tablet Take 0.5 mg by mouth 2 (two) times daily. 04/29/19  Yes [provider]  Ascorbic Acid (VITAMIN C PO) Take 1 tablet by mouth daily.   Yes [provider]  cholecalciferol (VITAMIN D3) 25 MCG (1000 UNIT) tablet Take 1,000 Units by mouth daily.   Yes [provider]  dicyclomine (BENTYL) 10 MG capsule Take 1 capsule (10 mg total) by mouth 3 (three) times daily as needed for spasms. 03/03/17  Yes Charlesetta Shanks, MD  levothyroxine (SYNTHROID) 112 MCG tablet Take 112 mcg by mouth every morning. 04/27/19  Yes [provider]  LORazepam (ATIVAN) 0.5 MG tablet Take 0.5 mg by mouth every 8 (eight) hours as needed for anxiety.    Yes [provider]  meclizine (ANTIVERT) 25 MG tablet Take 25 mg by mouth 2 (two) times daily as needed for dizziness.  06/26/19  Yes [provider]  metFORMIN (GLUCOPHAGE-XR) 500 MG 24  hr tablet Take 1,000 mg by mouth daily. 04/06/19  Yes [provider]  Multiple Vitamins-Minerals (ZINC PO) Take 1 tablet by mouth daily.   Yes [provider]  nortriptyline (PAMELOR) 10 MG capsule Take 10 mg by mouth at bedtime. 07/10/19  Yes [provider]  omeprazole (PRILOSEC) 40 MG capsule Take 40 mg by mouth daily.   Yes [provider]  potassium chloride SA (KLOR-CON) 20 MEQ tablet Take 1 tablet (20 mEq  total) by mouth daily for 5 days. 08/02/19 08/07/19 Yes Hall, Lorenda Cahill, DO  predniSONE (DELTASONE) 20 MG tablet Take 2 tablets (40 mg total) by mouth daily with breakfast for 14 days. 08/03/19 08/17/19 Yes Hall, Carole N, DO  sertraline (ZOLOFT) 50 MG tablet Take 50 mg by mouth daily. 04/24/19  Yes [provider]  simvastatin (ZOCOR) 10 MG tablet Take 10 mg by mouth daily.   Yes [provider]  valsartan (DIOVAN) 160 MG tablet Take 160 mg by mouth daily. 03/23/19  Yes [provider]  promethazine (PHENERGAN) 12.5 MG tablet Take 12.5 mg by mouth every 6 (six) hours as needed for nausea/vomiting. 06/26/19 08/06/19 Yes [provider]  ondansetron (ZOFRAN ODT) 4 MG disintegrating tablet Take 1 tablet (4 mg total) by mouth every 8 (eight) hours as needed for nausea or vomiting. 08/06/19   Nuala Alpha A, PA-C    Allergies    Ciprofloxacin, Prozac [fluoxetine hcl], and Hydralazine  Review of Systems   Review of Systems Ten systems are reviewed and are negative for acute change except as noted in the HPI Physical Exam Updated Vital Signs BP (!) 114/99 (BP Location: Right Arm)   Pulse 88   Temp 98.4 F (36.9 C) (Oral)   Resp 16   Ht 5' 4.25" (1.632 m)   Wt 74 kg   SpO2 97%   BMI 27.79 kg/m   Physical Exam Constitutional:      General: She is not in acute distress.    Appearance: Normal appearance. She is well-developed. She is not ill-appearing or diaphoretic.  HENT:     Head: Normocephalic and atraumatic.     Right Ear: External ear normal.     Left Ear: External ear normal.     Nose: Nose normal.  Eyes:     General: Vision grossly intact. Gaze aligned appropriately.     Pupils: Pupils are equal, round, and reactive to light.  Neck:     Trachea: Trachea and phonation normal. No tracheal deviation.  Pulmonary:     Effort: Pulmonary effort is normal. No respiratory distress.  Abdominal:     General: There is no distension.     Palpations: Abdomen  is soft.     Tenderness: There is no abdominal tenderness. There is no guarding or rebound.  Musculoskeletal:        General: Normal range of motion.     Cervical back: Normal range of motion.  Skin:    General: Skin is warm and dry.  Neurological:     Mental Status: She is alert.     GCS: GCS eye subscore is 4. GCS verbal subscore is 5. GCS motor subscore is 6.     Comments: Speech is clear and goal oriented, follows commands Major Cranial nerves without deficit, no facial droop Moves extremities without ataxia, coordination intact  Psychiatric:        Behavior: Behavior normal.     ED Results / Procedures / Treatments   Labs (all labs ordered  are listed, but only abnormal results are displayed) Labs Reviewed  COMPREHENSIVE METABOLIC PANEL - Abnormal; Notable for the following components:      Result Value   Glucose, Bld 150 (*)    All other components within normal limits  CBC - Abnormal; Notable for the following components:   WBC 11.2 (*)    All other components within normal limits  URINALYSIS, ROUTINE W REFLEX MICROSCOPIC - Abnormal; Notable for the following components:   Color, Urine STRAW (*)    Leukocytes,Ua TRACE (*)    All other components within normal limits  GI PATHOGEN PANEL BY PCR, STOOL  C DIFFICILE QUICK SCREEN W PCR REFLEX  LIPASE, BLOOD    EKG None  Radiology No results found.  Procedures Procedures (including critical care time)  Medications Ordered in ED Medications  sodium chloride flush (NS) 0.9 % injection 3 mL (3 mLs Intravenous Given 08/06/19 1818)  sodium chloride 0.9 % bolus 500 mL (0 mLs Intravenous Stopped 08/06/19 2053)    ED Course  I have reviewed the triage vital signs and the nursing notes.  Pertinent labs & imaging results that were available during my care of the patient were reviewed by me and considered in my medical decision making (see chart for details).  Clinical Course as of Aug 05 2236  Thu Aug 06, 2019  2014  CLINICAL DATA: Abdomen pain, history of SBO  EXAM: ABDOMEN - 1 VIEW  COMPARISON: 06/21/2019  FINDINGS: Nonobstructed gas pattern. Moderate stool in the colon. Clips in the right upper quadrant. No radiopaque calculi  IMPRESSION: Nonobstructed bowel-gas pattern   Electronically Signed By: Donavan Foil M.D. On: 08/06/2019 19:08   [BM]  2130 Dr. Lyndel Safe   [BM]    Clinical Course User Index [BM] Gari Crown   MDM Rules/Calculators/A&P                     74 year old female presents today for lower abdominal cramping, intermittent nonbloody diarrhea and chills onset around 3 days ago shortly after leaving the hospital after admission for partial small bowel obstruction.  On evaluation she is well-appearing, no acute distress, vital signs stable.  Abdomen is soft nontender without peritoneal signs.  She reports lower abdominal pain is only present during episodes of diarrhea. - Discharge summary reviewed, appears patient was admitted on 07/27/2019 and discharged on 08/02/2019.  It appears primary problem was partial small bowel obstruction, she electrolyte repletion at that time, tolerating soft diet.  She was treated with IV Rocephin and Flagyl, also started on Solu-Medrol followed by prednisone for concern of Crohn's ileitis.  She was scheduled to follow-up with GI in a week. - Basic abdominal pain lab work including CBC, CMP, lipase and urinalysis were ordered in triage.  I discussed potential of imaging including CT scan of the abdomen pelvis with patient for assessment of etiology of her symptoms today, she refused she is requesting only an x-ray as she wants to avoid excess radiation as she has had multiple CT scans in the past 2 months.  As patient's abdomen is soft nontender and without peritoneal signs and has normal vital signs without evidence of fever tachycardia or hypotension plan from x-ray is reasonable at this time.  Additionally patient with recent mission with IV  antibiotics, and GI stool panel with C. difficile screen has been ordered.  Of note patient denies any pain at this time. - CBC shows leukocytosis of 11.2 suspect secondary from recent steroid use, no  fever or tachycardia.  Hemoglobin within normal limits.  Lipase within normal limits evidence of pancreatitis  CMP shows glucose 115, otherwise within normal limits no emergent electrolyte derangement, elevation of LFTs or evidence of kidney injury  Urinalysis shows trace leukocytes, no evidence of infection, ketones to suggest dehydration, or hemoglobin to suggest stone disease  DG Abd 1 view:  IMPRESSION: Nonobstructed bowel-gas pattern - 9:30 PM: I discussed the case with gastroenterologist Dr. Lyndel Safe who recommends that patient begin a clear liquid diet today transition to full liquid diet tomorrow and follow-up with GI next week.  Likely leukocytosis secondary to steroid use.  Recommends either ODT Zofran or Phenergan suppositories to go home with and continuation of meds given to her at discharge on March 7. - I reevaluated the patient she sitting up on the edge of bed requesting discharge.  Reports that she is feeling well and has no new concerns.  She states understanding of work-up as above and has no questions.  She is agreeable to discharge with antiemetics and follow-up with gastroenterology.  She is aware to stop taking the previously prescribed oral promethazine and has asked for prescription of ODT Zofran at this time.  She refused suppository promethazine.  Of note patient was unable to provide a stool sample for [place] today, reports diarrhea has improved.  At this time there does not appear to be any evidence of an acute emergency medical condition and the patient appears stable for discharge with appropriate outpatient follow up. Diagnosis was discussed with patient who verbalizes understanding of care plan and is agreeable to discharge. I have discussed return precautions with patient  who verbalizes understanding of return precautions. Patient encouraged to follow-up with their PCP and GI. All questions answered.  Patient seen and evaluated by Dr. Sedonia Small during this visit who agrees with discharge and outpatient GI follow-up.  Note: Portions of this report may have been transcribed using voice recognition software. Every effort was made to ensure accuracy; however, inadvertent computerized transcription errors may still be present. Final Clinical Impression(s) / ED Diagnoses Final diagnoses:  Diarrhea, unspecified type    Rx / DC Orders ED Discharge Orders         Ordered    ondansetron (ZOFRAN ODT) 4 MG disintegrating tablet  Every 8 hours PRN     08/06/19 2234           Gari Crown 08/06/19 2238    Maudie Flakes, MD 08/07/19 [phone]

## 2019-08-06 NOTE — Discharge Instructions (Addendum)
You have been diagnosed today with Diarrhea.  At this time there does not appear to be the presence of an emergent medical condition, however there is always the potential for conditions to change. Please read and follow the below instructions.  Please return to the Emergency Department immediately for any new or worsening symptoms or if your symptoms do not improve in the next 2 days. Please be sure to follow up with your Primary Care Provider within one week regarding your visit today; please call their office to schedule an appointment even if you are feeling better for a follow-up visit. Stop taking the medication promethazine (Phenergan).  You may begin taking the medication Zofran as prescribed, this will replace the Phenergan.  The Zofran prescribed to you today will dissolve on your tongue, only use as prescribed. Please drink plenty of water and get plenty of rest.  Follow a clear liquid diet today and you may progress to a full liquid diet starting tomorrow.  Please drink plenty water to avoid dehydration and make healthy dietary choices.  Get help right away if: You have chest pain. You have a fever You feel very weak or you pass out (faint). You have bloody or black poop or poop that looks like tar. You have very bad pain, cramping, or bloating in your belly (abdomen). You have trouble breathing or you are breathing very quickly. Your heart is beating very quickly. Your skin feels cold and clammy. You feel confused. You have signs of losing too much water in your body, such as: Dark pee, very little pee, or no pee. Cracked lips. Dry mouth. Sunken eyes. Sleepiness. Weakness. You have any new/concerning or worsening of symptoms  Please read the additional information packets attached to your discharge summary.  Do not take your medicine if  develop an itchy rash, swelling in your mouth or lips, or difficulty breathing; call 911 and seek immediate emergency medical attention if  this occurs.  Note: Portions of this text may have been transcribed using voice recognition software. Every effort was made to ensure accuracy; however, inadvertent computerized transcription errors may still be present.

## 2019-08-06 NOTE — Telephone Encounter (Signed)
Pt stated she  is very sick on her stomach  And experiencing chills

## 2019-08-06 NOTE — Telephone Encounter (Signed)
See recent recommendation for diarrhea In addition please obtain a GI pathogen panel, CMP, CBC

## 2019-08-06 NOTE — Telephone Encounter (Signed)
Patient is calling says she is not feeling well at all and is requesting to speak with you.

## 2019-08-07 NOTE — Telephone Encounter (Signed)
Pt called to give an update from her ER visit. Pt states she does not want to go back there, states she was put in a room and left for an hour. She reports staying until midnight. Pt knows to keep her appt as scheduled for next week.

## 2019-08-07 NOTE — Telephone Encounter (Signed)
Patient is requesting to speak with you in reference to previous message.

## 2019-08-08 ENCOUNTER — Emergency Department (HOSPITAL_BASED_OUTPATIENT_CLINIC_OR_DEPARTMENT_OTHER): Payer: Medicare Other

## 2019-08-08 ENCOUNTER — Other Ambulatory Visit: Payer: Self-pay

## 2019-08-08 ENCOUNTER — Encounter (HOSPITAL_BASED_OUTPATIENT_CLINIC_OR_DEPARTMENT_OTHER): Payer: Self-pay | Admitting: Emergency Medicine

## 2019-08-08 ENCOUNTER — Emergency Department (HOSPITAL_BASED_OUTPATIENT_CLINIC_OR_DEPARTMENT_OTHER)
Admission: EM | Admit: 2019-08-08 | Discharge: 2019-08-08 | Disposition: A | Discharge status: Home / Self Care | Payer: Medicare Other | Attending: Emergency Medicine | Admitting: Emergency Medicine

## 2019-08-08 DIAGNOSIS — E119 Type 2 diabetes mellitus without complications: Secondary | ICD-10-CM | POA: Diagnosis not present

## 2019-08-08 DIAGNOSIS — R1031 Right lower quadrant pain: Secondary | ICD-10-CM | POA: Diagnosis present

## 2019-08-08 DIAGNOSIS — E039 Hypothyroidism, unspecified: Secondary | ICD-10-CM | POA: Insufficient documentation

## 2019-08-08 DIAGNOSIS — Z881 Allergy status to other antibiotic agents status: Secondary | ICD-10-CM | POA: Diagnosis not present

## 2019-08-08 DIAGNOSIS — Z79899 Other long term (current) drug therapy: Secondary | ICD-10-CM | POA: Insufficient documentation

## 2019-08-08 DIAGNOSIS — N3000 Acute cystitis without hematuria: Secondary | ICD-10-CM | POA: Diagnosis not present

## 2019-08-08 DIAGNOSIS — Z888 Allergy status to other drugs, medicaments and biological substances status: Secondary | ICD-10-CM | POA: Diagnosis not present

## 2019-08-08 DIAGNOSIS — E782 Mixed hyperlipidemia: Secondary | ICD-10-CM | POA: Diagnosis not present

## 2019-08-08 DIAGNOSIS — I10 Essential (primary) hypertension: Secondary | ICD-10-CM | POA: Insufficient documentation

## 2019-08-08 LAB — CBC WITH DIFFERENTIAL/PLATELET
Abs Immature Granulocytes: 0.04 10*3/uL (ref 0.00–0.07)
Basophils Absolute: 0 10*3/uL (ref 0.0–0.1)
Basophils Relative: 0 %
Eosinophils Absolute: 0 10*3/uL (ref 0.0–0.5)
Eosinophils Relative: 0 %
HCT: 41 % (ref 36.0–46.0)
Hemoglobin: 13.7 g/dL (ref 12.0–15.0)
Immature Granulocytes: 0 %
Lymphocytes Relative: 29 %
Lymphs Abs: 2.7 10*3/uL (ref 0.7–4.0)
MCH: 29.9 pg (ref 26.0–34.0)
MCHC: 33.4 g/dL (ref 30.0–36.0)
MCV: 89.5 fL (ref 80.0–100.0)
Monocytes Absolute: 0.8 10*3/uL (ref 0.1–1.0)
Monocytes Relative: 9 %
Neutro Abs: 5.5 10*3/uL (ref 1.7–7.7)
Neutrophils Relative %: 62 %
Platelets: 282 10*3/uL (ref 150–400)
RBC: 4.58 MIL/uL (ref 3.87–5.11)
RDW: 12.8 % (ref 11.5–15.5)
WBC: 9 10*3/uL (ref 4.0–10.5)
nRBC: 0 % (ref 0.0–0.2)

## 2019-08-08 LAB — COMPREHENSIVE METABOLIC PANEL
ALT: 23 U/L (ref 0–44)
AST: 19 U/L (ref 15–41)
Albumin: 4.2 g/dL (ref 3.5–5.0)
Alkaline Phosphatase: 84 U/L (ref 38–126)
Anion gap: 11 (ref 5–15)
BUN: 16 mg/dL (ref 8–23)
CO2: 22 mmol/L (ref 22–32)
Calcium: 9.7 mg/dL (ref 8.9–10.3)
Chloride: 102 mmol/L (ref 98–111)
Creatinine, Ser: 0.82 mg/dL (ref 0.44–1.00)
GFR calc Af Amer: >60 mL/min (ref >60)
GFR calc non Af Amer: >60 mL/min (ref >60)
Glucose, Bld: 113 mg/dL — ABNORMAL HIGH (ref 70–99)
Potassium: 3.7 mmol/L (ref 3.5–5.1)
Sodium: 135 mmol/L (ref 135–145)
Total Bilirubin: 0.9 mg/dL (ref 0.3–1.2)
Total Protein: 7.5 g/dL (ref 6.5–8.1)

## 2019-08-08 LAB — URINALYSIS, ROUTINE W REFLEX MICROSCOPIC
Bilirubin Urine: NEGATIVE
Glucose, UA: NEGATIVE mg/dL
Hgb urine dipstick: NEGATIVE
Ketones, ur: 15 mg/dL — AB
Nitrite: NEGATIVE
Protein, ur: NEGATIVE mg/dL
Specific Gravity, Urine: 1.01 (ref 1.005–1.030)
pH: 7.5 (ref 5.0–8.0)

## 2019-08-08 LAB — URINALYSIS, MICROSCOPIC (REFLEX): RBC / HPF: NONE SEEN RBC/hpf (ref 0–5)

## 2019-08-08 MED ORDER — SODIUM CHLORIDE 0.9 % IV SOLN
1.0000 g | Freq: Once | INTRAVENOUS | Status: AC
  Administered 2019-08-08: 1 g via INTRAVENOUS
  Filled 2019-08-08: qty 10

## 2019-08-08 MED ORDER — CEPHALEXIN 500 MG PO CAPS: 500.0000 mg | ORAL_CAPSULE | Freq: Four times a day (QID) | ORAL | 0 refills | Status: DC

## 2019-08-08 MED ORDER — MORPHINE SULFATE (PF) 4 MG/ML IV SOLN
4.0000 mg | Freq: Once | INTRAVENOUS | Status: AC
  Administered 2019-08-08: 4 mg via INTRAVENOUS
  Filled 2019-08-08: qty 1

## 2019-08-08 MED ORDER — PROMETHAZINE HCL 12.5 MG PO TABS: 12.5000 mg | ORAL_TABLET | Freq: Four times a day (QID) | ORAL | 0 refills | Status: DC | PRN

## 2019-08-08 MED ORDER — HYDROCODONE-ACETAMINOPHEN 5-325 MG PO TABS: 1.0000 | ORAL_TABLET | ORAL | 0 refills | Status: DC | PRN

## 2019-08-08 MED ORDER — FENTANYL CITRATE (PF) 100 MCG/2ML IJ SOLN
100.0000 ug | Freq: Once | INTRAMUSCULAR | Status: AC
  Administered 2019-08-08: 100 ug via INTRAVENOUS
  Filled 2019-08-08: qty 2

## 2019-08-08 MED ORDER — ONDANSETRON HCL 4 MG/2ML IJ SOLN
INTRAMUSCULAR | Status: AC
  Administered 2019-08-08: 4 mg via INTRAVENOUS
  Filled 2019-08-08: qty 2

## 2019-08-08 MED ORDER — IOHEXOL 300 MG/ML  SOLN
100.0000 mL | Freq: Once | INTRAMUSCULAR | Status: AC | PRN
  Administered 2019-08-08: 100 mL via INTRAVENOUS

## 2019-08-08 MED ORDER — ONDANSETRON HCL 4 MG/2ML IJ SOLN: 4.0000 mg | Freq: Once | INTRAMUSCULAR | Status: AC

## 2019-08-08 MED ORDER — ALIGN PREBIOTIC-PROBIOTIC 5-1.25 MG-GM PO CHEW: 1.0000 | CHEWABLE_TABLET | Freq: Every day | ORAL | 0 refills | Status: AC

## 2019-08-08 NOTE — ED Provider Notes (Addendum)
Pt signed out by Dr. Florina Ou pending labs and xrays.  Pt does not feel like she's gotten any better since her d/c for a sbo due to terminal ileitis on 3/7.  She came to the ED on 3/11 and is back today.  3 way abd ok, but pt still has pain after 100 mcg fentanyl.  So, a repeat CT abd/pelvis has been ordered.  IMPRESSION:  1. No acute findings within the abdomen or pelvis.  2. Small bowel shows fatty replacement changes along the wall of the  distal ileum, with a focal area of mild dilation of the terminal  ileum. 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 There are no findings of active inflammation,  however.  3. Colonic diverticula without evidence of diverticulitis.  4. Aortic atherosclerosis.     CT shows nothing acute.  Pt does have a UTI which will be treated with keflex.  Pt will also be put on probiotics as she's had diarrhea.  Pt does have an appt with Dr. Fuller Plan for a colonoscopy on March 24.  Pt is feeling better.  She is stable for d/c.  Return if worse.   Isla Pence, MD 08/08/19 2883    Isla Pence, MD 08/08/19 [phone]

## 2019-08-08 NOTE — ED Provider Notes (Signed)
[facility] Provider Note: Carrie Spurling, MD, FACEP  CSN: [id number] MRN: [id number] ARRIVAL: 08/08/19 at [place]: [place]  Abdominal Pain   HISTORY OF PRESENT ILLNESS  08/08/19 6:43 AM Carrie Watkins is a 74 y.o. female with a history of multiple small bowel obstructions, most recently 07/27/2019.  This was believed to be due to Crohn's disease and she was discharged from the hospital on steroids.  She returned to the ED on 08/06/2019 for diarrhea but was discharged home when plain films showed no evidence of recurrent small bowel obstruction.  She is here this morning with right lower quadrant abdominal pain that began yesterday evening.  It is sharp in nature and worse with movement or palpation.  She rates it as an 8 out of 10.  She has associated nausea but no vomiting, diarrhea or constipation.  She has had chills but no fever.  She did not take her dose of steroids yesterday due to nausea.  She notices some abdominal distention but not as severe as when she had a bowel obstruction.    Past Medical History:  Diagnosis Date  . Anemia   . Diabetes mellitus without complication ([place])   . GERD (gastroesophageal reflux disease)   . GI bleed   . High cholesterol   . Hypertension   . Thyroid disease     History reviewed. No pertinent surgical history.  Family History  Problem Relation Age of Onset  . Heart failure Mother     Social History   Tobacco Use  . Smoking status: Never Smoker  . Smokeless tobacco: Never Used  Substance Use Topics  . Alcohol use: No  . Drug use: Never    Prior to Admission medications   Medication Sig Start Date End Date Taking? Authorizing Provider  acetaminophen (TYLENOL) 325 MG tablet Take 650 mg by mouth every 6 (six) hours as needed for mild pain or headache.    [provider]  ALPRAZolam Duanne Moron) 0.5 MG tablet Take 0.5 mg by mouth 2 (two) times daily. 04/29/19   [provider]  Ascorbic  Acid (VITAMIN C PO) Take 1 tablet by mouth daily.    [provider]  Bacillus Coagulans-Inulin (ALIGN PREBIOTIC-PROBIOTIC) 5-1.25 MG-GM CHEW Chew 1 tablet by mouth daily. 08/08/19   Isla Pence, MD  cephALEXin (KEFLEX) 500 MG capsule Take 1 capsule (500 mg total) by mouth 4 (four) times daily. 08/08/19   Isla Pence, MD  cholecalciferol (VITAMIN D3) 25 MCG (1000 UNIT) tablet Take 1,000 Units by mouth daily.    [provider]  dicyclomine (BENTYL) 10 MG capsule Take 1 capsule (10 mg total) by mouth 3 (three) times daily as needed for spasms. 03/03/17   Charlesetta Shanks, MD  HYDROcodone-acetaminophen (NORCO/VICODIN) 5-325 MG tablet Take 1 tablet by mouth every 4 (four) hours as needed. 08/08/19   Isla Pence, MD  levothyroxine (SYNTHROID) 112 MCG tablet Take 112 mcg by mouth every morning. 04/27/19   [provider]  LORazepam (ATIVAN) 0.5 MG tablet Take 0.5 mg by mouth every 8 (eight) hours as needed for anxiety.     [provider]  meclizine (ANTIVERT) 25 MG tablet Take 25 mg by mouth 2 (two) times daily as needed for dizziness.  06/26/19   [provider]  metFORMIN (GLUCOPHAGE-XR) 500 MG 24 hr tablet Take 1,000 mg by mouth daily. 04/06/19   [provider]  Multiple Vitamins-Minerals (ZINC PO) Take 1 tablet by mouth daily.  [provider]  nortriptyline (PAMELOR) 10 MG capsule Take 10 mg by mouth at bedtime. 07/10/19   [provider]  omeprazole (PRILOSEC) 40 MG capsule Take 40 mg by mouth daily.    [provider]  ondansetron (ZOFRAN ODT) 4 MG disintegrating tablet Take 1 tablet (4 mg total) by mouth every 8 (eight) hours as needed for nausea or vomiting. 08/06/19   Nuala Alpha A, PA-C  potassium chloride SA (KLOR-CON) 20 MEQ tablet Take 1 tablet (20 mEq total) by mouth daily for 5 days. 08/02/19 08/07/19  Kayleen Memos, DO  predniSONE (DELTASONE) 20 MG tablet Take 2 tablets (40 mg total) by mouth daily  with breakfast for 14 days. 08/03/19 08/17/19  Kayleen Memos, DO  promethazine (PHENERGAN) 12.5 MG tablet Take 1 tablet (12.5 mg total) by mouth every 6 (six) hours as needed for nausea or vomiting. 08/08/19   Isla Pence, MD  sertraline (ZOLOFT) 50 MG tablet Take 50 mg by mouth daily. 04/24/19   [provider]  simvastatin (ZOCOR) 10 MG tablet Take 10 mg by mouth daily.    [provider]  valsartan (DIOVAN) 160 MG tablet Take 160 mg by mouth daily. 03/23/19   [provider]    Allergies Ciprofloxacin, Prozac [fluoxetine hcl], and Hydralazine   REVIEW OF SYSTEMS  Negative except as noted here or in the History of Present Illness.   PHYSICAL EXAMINATION  Initial Vital Signs Blood pressure (!) 173/70, pulse 82, temperature (!) 97.4 F (36.3 C), temperature source Oral, resp. rate 18, height 5' 4"  (1.626 m), weight 68.9 kg, SpO2 100 %.  Examination General: Well-developed, well-nourished female in no acute distress; appearance consistent with age of record HENT: normocephalic; atraumatic Eyes: pupils equal, round and reactive to light; extraocular muscles intact Neck: supple Heart: regular rate and rhythm Lungs: clear to auscultation bilaterally Abdomen: soft; mildly distended; right lower quadrant tenderness; bowel sounds present Extremities: No deformity; full range of motion; pulses normal Neurologic: Awake, alert and oriented; motor function intact in all extremities and symmetric; no facial droop Skin: Warm and dry Psychiatric: Flat affect   RESULTS  Summary of this visit's results, reviewed and interpreted by myself:   EKG Interpretation  Date/Time:    Ventricular Rate:    PR Interval:    QRS Duration:   QT Interval:    QTC Calculation:   R Axis:     Text Interpretation:        Laboratory Studies: Results for orders placed or performed during the hospital encounter of 08/08/19 (from the past 24 hour(s))  CBC with Differential      Status: None   Collection Time: 08/08/19  6:31 AM  Result Value Ref Range   WBC 9.0 4.0 - 10.5 K/uL   RBC 4.58 3.87 - 5.11 MIL/uL   Hemoglobin 13.7 12.0 - 15.0 g/dL   HCT 41.0 36.0 - 46.0 %   MCV 89.5 80.0 - 100.0 fL   MCH 29.9 26.0 - 34.0 pg   MCHC 33.4 30.0 - 36.0 g/dL   RDW 12.8 11.5 - 15.5 %   Platelets 282 150 - 400 K/uL   nRBC 0.0 0.0 - 0.2 %   Neutrophils Relative % 62 %   Neutro Abs 5.5 1.7 - 7.7 K/uL   Lymphocytes Relative 29 %   Lymphs Abs 2.7 0.7 - 4.0 K/uL   Monocytes Relative 9 %   Monocytes Absolute 0.8 0.1 - 1.0 K/uL   Eosinophils Relative 0 %  Eosinophils Absolute 0.0 0.0 - 0.5 K/uL   Basophils Relative 0 %   Basophils Absolute 0.0 0.0 - 0.1 K/uL   Immature Granulocytes 0 %   Abs Immature Granulocytes 0.04 0.00 - 0.07 K/uL  Comprehensive metabolic panel     Status: Abnormal   Collection Time: 08/08/19  6:31 AM  Result Value Ref Range   Sodium 135 135 - 145 mmol/L   Potassium 3.7 3.5 - 5.1 mmol/L   Chloride 102 98 - 111 mmol/L   CO2 22 22 - 32 mmol/L   Glucose, Bld 113 (H) 70 - 99 mg/dL   BUN 16 8 - 23 mg/dL   Creatinine, Ser 0.82 0.44 - 1.00 mg/dL   Calcium 9.7 8.9 - 10.3 mg/dL   Total Protein 7.5 6.5 - 8.1 g/dL   Albumin 4.2 3.5 - 5.0 g/dL   AST 19 15 - 41 U/L   ALT 23 0 - 44 U/L   Alkaline Phosphatase 84 38 - 126 U/L   Total Bilirubin 0.9 0.3 - 1.2 mg/dL   GFR calc non Af Amer >60 >60 mL/min   GFR calc Af Amer >60 >60 mL/min   Anion gap 11 5 - 15  Urinalysis, Routine w reflex microscopic     Status: Abnormal   Collection Time: 08/08/19  7:14 AM  Result Value Ref Range   Color, Urine YELLOW YELLOW   APPearance HAZY (A) CLEAR   Specific Gravity, Urine 1.010 1.005 - 1.030   pH 7.5 5.0 - 8.0   Glucose, UA NEGATIVE NEGATIVE mg/dL   Hgb urine dipstick NEGATIVE NEGATIVE   Bilirubin Urine NEGATIVE NEGATIVE   Ketones, ur 15 (A) NEGATIVE mg/dL   Protein, ur NEGATIVE NEGATIVE mg/dL   Nitrite NEGATIVE NEGATIVE   Leukocytes,Ua LARGE (A) NEGATIVE    Urinalysis, Microscopic (reflex)     Status: Abnormal   Collection Time: 08/08/19  7:14 AM  Result Value Ref Range   RBC / HPF NONE SEEN 0 - 5 RBC/hpf   WBC, UA 21-50 0 - 5 WBC/hpf   Bacteria, UA MANY (A) NONE SEEN   Squamous Epithelial / LPF 11-20 0 - 5   Imaging Studies: CT ABDOMEN PELVIS W CONTRAST  Result Date: 08/08/2019 CLINICAL DATA:  Pt with RLQ abdominal pain and nausea, denies vomiting, condones alternating diarrhea and constipation, pt states she has been hospitalized recently x 2 and has had 4 CT scans in the last 3 months, due to have colonoscopy but states that she hasn't "healed" enough, denies fever EXAM: CT ABDOMEN AND PELVIS WITH CONTRAST TECHNIQUE: Multidetector CT imaging of the abdomen and pelvis was performed using the standard protocol following bolus administration of intravenous contrast. CONTRAST:  156m OMNIPAQUE IOHEXOL 300 MG/ML  SOLN COMPARISON:  07/28/2019 FINDINGS: Lower chest: Minor atelectasis at the left lung base. Lungs otherwise clear. Heart normal in size. Hepatobiliary: Multiple low-attenuation liver masses consistent with cysts, stable since the prior exam. Liver normal in size. Status post cholecystectomy. Mild chronic dilation of the common bile duct with distal tapering, also unchanged. Pancreas: Unremarkable. No pancreatic ductal dilatation or surrounding inflammatory changes. Spleen: Normal in size without focal abnormality. Adrenals/Urinary Tract: 12 mm right adrenal nodule, unchanged, stable from a CT dated 10/05/2016, consistent with an adenoma. Normal left adrenal gland. Kidneys normal in size, orientation and position with symmetric enhancement and excretion. Small lower pole renal sinus cyst on the left. No other masses, no stones and no hydronephrosis. Normal ureters. Normal bladder. Stomach/Bowel: Normal stomach. There is fatty replacement  in the wall of the distal ileum. A focal area of mild dilation is noted of the terminal ileum just proximal to  the ileocecal valve. Remainder of the small bowel is normal in caliber. There is no mesenteric inflammation. Numerous colonic diverticula noted on the left. No evidence of diverticulitis, colonic wall thickening or other inflammatory process. No evidence of appendicitis. Vascular/Lymphatic: Aortic atherosclerosis. No aneurysm. No enlarged lymph nodes. Reproductive: Uterus and bilateral adnexa are unremarkable. Other: No abdominal wall hernia or abnormality. No abdominopelvic ascites. Musculoskeletal: No fracture or acute finding. No osteoblastic or osteolytic lesions. IMPRESSION: 1. No acute findings within the abdomen or pelvis. 2. Small bowel shows fatty replacement changes along the wall of the distal ileum, with a focal area of mild dilation of the terminal ileum. These findings suggest chronic changes from inflammatory bowel disease. There are no findings of active inflammation, however. 3. Colonic diverticula without evidence of diverticulitis. 4. Aortic atherosclerosis. Electronically Signed   By: Lajean Manes M.D.   On: 08/08/2019 08:24   DG Abdomen Acute W/Chest  Result Date: 08/08/2019 CLINICAL DATA:  Right lower quadrant pain, recent SBO EXAM: DG ABDOMEN ACUTE W/ 1V CHEST COMPARISON:  08/06/2019 FINDINGS: Lungs are clear.  No pleural effusion or pneumothorax. The heart is normal in size.  Thoracic aortic atherosclerosis. Nonobstructive bowel gas pattern. No evidence of free air under the diaphragm on the upright view. Mild colonic stool burden. Cholecystectomy clips. Mild degenerative changes of the mid thoracic and lower lumbar spine. IMPRESSION: No evidence of acute cardiopulmonary disease. No evidence of small bowel obstruction or free air. Electronically Signed   By: Julian Hy M.D.   On: 08/08/2019 07:05    ED COURSE and MDM  Nursing notes, initial and subsequent vitals signs, including pulse oximetry, reviewed and interpreted by myself.  Vitals:   08/08/19 0622 08/08/19 0623  08/08/19 0935  BP: (!) 173/70  (!) 170/70  Pulse: 82  90  Resp: 18  18  Temp: (!) 97.4 F (36.3 C)  97.8 F (36.6 C)  TempSrc: Oral  Oral  SpO2: 100%  100%  Weight:  68.9 kg   Height:  5' 4"  (1.626 m)    Medications  ondansetron (ZOFRAN) injection 4 mg (4 mg Intravenous Given 08/08/19 0637)  fentaNYL (SUBLIMAZE) injection 100 mcg (100 mcg Intravenous Given 08/08/19 0652)  morphine 4 MG/ML injection 4 mg (4 mg Intravenous Given 08/08/19 0747)  iohexol (OMNIPAQUE) 300 MG/ML solution 100 mL (100 mLs Intravenous Contrast Given 08/08/19 0758)  cefTRIAXone (ROCEPHIN) 1 g in sodium chloride 0.9 % 100 mL IVPB (0 g Intravenous Stopped 08/08/19 0922)   7:00 AM Signed out to Dr. Carolynne Edouard.   PROCEDURES  Procedures   ED DIAGNOSES     ICD-10-CM   1. Right lower quadrant abdominal pain  R10.31   2. Acute cystitis without hematuria  N30.00        Delano Frate, MD 08/08/19 2232

## 2019-08-08 NOTE — ED Triage Notes (Signed)
Pt c/o RLQ abd pain. Recent hx of partial SBO with worsening pain. Pt reports having BM yesterday.

## 2019-08-09 LAB — URINE CULTURE: Culture: NO GROWTH

## 2019-08-10 ENCOUNTER — Telehealth: Payer: Self-pay | Admitting: Internal Medicine

## 2019-08-10 NOTE — Telephone Encounter (Signed)
Called patient back and she states she has had just a little BM and Is passing some gas, asked if she would be alright until her office visit with Ellouise Newer PA tomorrow. I let her know she should be ok

## 2019-08-11 ENCOUNTER — Ambulatory Visit (INDEPENDENT_AMBULATORY_CARE_PROVIDER_SITE_OTHER): Payer: Medicare Other | Admitting: Physician Assistant

## 2019-08-11 ENCOUNTER — Encounter: Payer: Self-pay | Admitting: Physician Assistant

## 2019-08-11 ENCOUNTER — Other Ambulatory Visit: Payer: Self-pay

## 2019-08-11 VITALS — BP 130/60 | HR 60 | Temp 98.2°F | Ht 64.0 in | Wt 150.8 lb

## 2019-08-11 DIAGNOSIS — K529 Noninfective gastroenteritis and colitis, unspecified: Secondary | ICD-10-CM

## 2019-08-11 DIAGNOSIS — Z8719 Personal history of other diseases of the digestive system: Secondary | ICD-10-CM | POA: Diagnosis not present

## 2019-08-11 DIAGNOSIS — R112 Nausea with vomiting, unspecified: Secondary | ICD-10-CM | POA: Diagnosis not present

## 2019-08-11 MED ORDER — PREDNISONE 5 MG PO TABS: ORAL_TABLET | ORAL | 0 refills | Status: DC

## 2019-08-11 MED ORDER — SUPREP BOWEL PREP KIT 17.5-3.13-1.6 GM/177ML PO SOLN: 1.0000 | ORAL | 0 refills | Status: DC

## 2019-08-11 NOTE — Progress Notes (Signed)
Chief Complaint: Follow-up hospitalization for small bowel obstruction  HPI:    Mrs. Whatley is a 74 year old Caucasian female with a past medical history as listed below, who typically follows with digestive health but was seen by Dr. Fuller Plan in the hospital recently, who presents to clinic today for follow-up after recent hospitalization for small bowel obstruction.    07/28/2019-08/02/2019 patient admitted to the hospital for a small bowel obstruction.  CT at that time showed partial small bowel obstruction as well as chronic changes in the distal ileum with fatty deposition within thickened walls of equalization of the bowel contents consistent with delayed transit.  Patient was put on bowel rest and IV antibiotics were started.  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 Rainey Pines as planned.  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  Patient was discharged 08/02/2019 with prednisone 40 mg daily, decreased to 30 mg daily after 2 weeks.  Dr. Fuller Plan planned an outpatient colonoscopy in 2 to 3 weeks.    08/03/2019 patient was set up for colonoscopy in the [facility] with Dr. Fuller Plan 08/19/2019 at 130.    08/06/2019 patient called and described that she was having diarrhea all morning, chills and shaky with shortness of breath.  Was recommended she have a GI pathogen panel, CMP and CBC.  Patient sent to the ED.      08/06/2019 patient seen in the ED for abdominal pain, diarrhea and chills.  At that time CMP normal.  CBC with a white count 11.2.  GI pathogen panel and C. difficile ordered.  Lipase normal.  Abdominal x-ray with nonobstructive bowel gas pattern.  It is recommend patient stay on a clear liquid diet and transition to full liquids the next day.  Given antiemetics.    08/08/2019 patient seen in the ED with continued complaints.  Abdominal x-ray showed  nothing.  CT showed nothing acute.  Patient did have a UTI which was going to be treated with Keflex.  Also started on probiotics for diarrhea.    Today, the patient tells me that she is constantly nervous because she will have diarrhea then not pass stool at all then become nauseous and have similar symptoms to her obstruction.  Currently she is on an clear liquid diet and has been for the past 3 to 4 days.  Tells me that she is now feeling more constipated.  She did pass a small stool yesterday and this morning as well as still passing gas, but is definitely not having diarrhea anymore.  Tells me when she does not have a bowel movement she feels nauseous which also makes her nervous.  Currently still using her Zofran multiple times a day as well as Dicyclomine 10 mg 3 times daily.  Tells me she is not having abdominal pain.  Also on Prednisone 40 mg daily.    Denies fever, chills, vomiting or symptoms that awaken her from sleep.     Past Medical History:  Diagnosis Date  . Anemia   . Diabetes mellitus without complication ([place])   . GERD (gastroesophageal reflux disease)   . GI bleed   . High cholesterol   . Hypertension   . Thyroid disease     No past surgical history on file.  Current Outpatient Medications  Medication Sig Dispense Refill  . acetaminophen (TYLENOL) 325 MG tablet Take 650 mg by mouth every  6 (six) hours as needed for mild pain or headache.    . ALPRAZolam (XANAX) 0.5 MG tablet Take 0.5 mg by mouth 2 (two) times daily.    . Ascorbic Acid (VITAMIN C PO) Take 1 tablet by mouth daily.    . Bacillus Coagulans-Inulin (ALIGN PREBIOTIC-PROBIOTIC) 5-1.25 MG-GM CHEW Chew 1 tablet by mouth daily. 30 tablet 0  . cephALEXin (KEFLEX) 500 MG capsule Take 1 capsule (500 mg total) by mouth 4 (four) times daily. 28 capsule 0  . cholecalciferol (VITAMIN D3) 25 MCG (1000 UNIT) tablet Take 1,000 Units by mouth daily.    Marland Kitchen dicyclomine (BENTYL) 10 MG capsule Take 1 capsule (10 mg total) by mouth  3 (three) times daily as needed for spasms. 20 capsule 0  . HYDROcodone-acetaminophen (NORCO/VICODIN) 5-325 MG tablet Take 1 tablet by mouth every 4 (four) hours as needed. 10 tablet 0  . levothyroxine (SYNTHROID) 112 MCG tablet Take 112 mcg by mouth every morning.    Marland Kitchen LORazepam (ATIVAN) 0.5 MG tablet Take 0.5 mg by mouth every 8 (eight) hours as needed for anxiety.     . meclizine (ANTIVERT) 25 MG tablet Take 25 mg by mouth 2 (two) times daily as needed for dizziness.     . metFORMIN (GLUCOPHAGE-XR) 500 MG 24 hr tablet Take 1,000 mg by mouth daily.    . Multiple Vitamins-Minerals (ZINC PO) Take 1 tablet by mouth daily.    . nortriptyline (PAMELOR) 10 MG capsule Take 10 mg by mouth at bedtime.    Marland Kitchen omeprazole (PRILOSEC) 40 MG capsule Take 40 mg by mouth daily.    . ondansetron (ZOFRAN ODT) 4 MG disintegrating tablet Take 1 tablet (4 mg total) by mouth every 8 (eight) hours as needed for nausea or vomiting. 12 tablet 0  . potassium chloride SA (KLOR-CON) 20 MEQ tablet Take 1 tablet (20 mEq total) by mouth daily for 5 days. 5 tablet 0  . predniSONE (DELTASONE) 20 MG tablet Take 2 tablets (40 mg total) by mouth daily with breakfast for 14 days. 28 tablet 0  . promethazine (PHENERGAN) 12.5 MG tablet Take 1 tablet (12.5 mg total) by mouth every 6 (six) hours as needed for nausea or vomiting. 20 tablet 0  . sertraline (ZOLOFT) 50 MG tablet Take 50 mg by mouth daily.    . simvastatin (ZOCOR) 10 MG tablet Take 10 mg by mouth daily.    . valsartan (DIOVAN) 160 MG tablet Take 160 mg by mouth daily.     No current facility-administered medications for this visit.    Allergies as of 08/11/2019 - Review Complete 08/08/2019  Allergen Reaction Noted  . Ciprofloxacin Other (See Comments) 01/07/2019  . Prozac [fluoxetine hcl]  03/03/2017  . Hydralazine Itching and Rash 06/21/2019    Family History  Problem Relation Age of Onset  . Heart failure Mother     Social History   Socioeconomic History  .  Marital status: Married    Spouse name: Not on file  . Number of children: Not on file  . Years of education: Not on file  . Highest education level: Not on file  Occupational History  . Not on file  Tobacco Use  . Smoking status: Never Smoker  . Smokeless tobacco: Never Used  Substance and Sexual Activity  . Alcohol use: No  . Drug use: Never  . Sexual activity: Not on file  Other Topics Concern  . Not on file  Social History Narrative  . Not on file  Social Determinants of Health   Financial Resource Strain:   . Difficulty of Paying Living Expenses:   Food Insecurity:   . Worried About Charity fundraiser in the Last Year:   . Arboriculturist in the Last Year:   Transportation Needs:   . Film/video editor (Medical):   Marland Kitchen Lack of Transportation (Non-Medical):   Physical Activity:   . Days of Exercise per Week:   . Minutes of Exercise per Session:   Stress:   . Feeling of Stress :   Social Connections:   . Frequency of Communication with Friends and Family:   . Frequency of Social Gatherings with Friends and Family:   . Attends Religious Services:   . Active Member of Clubs or Organizations:   . Attends Archivist Meetings:   Marland Kitchen Marital Status:   Intimate Partner Violence:   . Fear of Current or Ex-Partner:   . Emotionally Abused:   Marland Kitchen Physically Abused:   . Sexually Abused:     Review of Systems:    Constitutional: No weight loss, fever or chills Cardiovascular: No chest pain   Respiratory: No SOB  Gastrointestinal: See HPI and otherwise negative   Physical Exam:  Vital signs: BP 130/60   Pulse 60   Temp 98.2 F (36.8 C)   Ht 5' 4"  (1.626 m)   Wt 150 lb 12.8 oz (68.4 kg)   BMI 25.88 kg/m   Constitutional:   Pleasant Elderly Caucasian female appears to be in NAD, Well developed, Well nourished, alert and cooperative Respiratory: Respirations even and unlabored. Lungs clear to auscultation bilaterally.   No wheezes, crackles, or rhonchi.    Cardiovascular: Normal S1, S2. No MRG. Regular rate and rhythm. No peripheral edema, cyanosis or pallor.  Gastrointestinal:  Soft, nondistended, nontender. No rebound or guarding. Normal bowel sounds. No appreciable masses or hepatomegaly. Rectal:  Not performed.  Psychiatric: Demonstrates good judgement and reason without abnormal affect or behaviors.  RELEVANT LABS AND IMAGING: CBC    Component Value Date/Time   WBC 9.0 08/08/2019 0631   RBC 4.58 08/08/2019 0631   HGB 13.7 08/08/2019 0631   HCT 41.0 08/08/2019 0631   PLT 282 08/08/2019 0631   MCV 89.5 08/08/2019 0631   MCH 29.9 08/08/2019 0631   MCHC 33.4 08/08/2019 0631   RDW 12.8 08/08/2019 0631   LYMPHSABS 2.7 08/08/2019 0631   MONOABS 0.8 08/08/2019 0631   EOSABS 0.0 08/08/2019 0631   BASOSABS 0.0 08/08/2019 0631    CMP     Component Value Date/Time   NA 135 08/08/2019 0631   K 3.7 08/08/2019 0631   CL 102 08/08/2019 0631   CO2 22 08/08/2019 0631   GLUCOSE 113 (H) 08/08/2019 0631   BUN 16 08/08/2019 0631   CREATININE 0.82 08/08/2019 0631   CALCIUM 9.7 08/08/2019 0631   PROT 7.5 08/08/2019 0631   ALBUMIN 4.2 08/08/2019 0631   AST 19 08/08/2019 0631   ALT 23 08/08/2019 0631   ALKPHOS 84 08/08/2019 0631   BILITOT 0.9 08/08/2019 0631   GFRNONAA >60 08/08/2019 0631   GFRAA >60 08/08/2019 0631    Assessment: 1.  History of partial small bowel obstruction: Multiple recurrences thought related to above, currently on Prednisone and passing stool and gas 2.  Ileitis: Suspected from IBD, currently on Prednisone  Plan: 1.  Tried to calm the patient today.  Explained that it can be hard for her to know if she is getting back to the  trouble that she had before, but we are hoping that since she is on Prednisone this is fixing the cause for her partial small bowel obstructions and this will not be a problem for her. 2.  Discussed that Prednisone is meant to be a taper.  She is to be on 40 mg daily for 2 weeks and then  decrease to 30 mg and then down by 5 mg/week.  Discussed this in detail and make sure she had enough Prednisone to last her.  She should decrease on March 22. 3.  Patient is already scheduled for a colonoscopy with Dr. Fuller Plan for further evaluation of ileum.  This is scheduled on March 24.  Patient will need to be took Covid tested 2 days prior to time procedure.  This is scheduled today.  Did discuss risks, benefits, limitations and alternatives the patient agrees to proceed. 4.  Would recommend the patient start MiraLAX once daily to try and keep things moving through. 5.  Discussed her diet today.  She can return to eating solid food but would recommend that she stay on low fiber/low residue.  Did provide her with information in regards to this. 6.  Patient to call our office before then if she has change in symptoms.  Otherwise she will follow-up with [place] per recommendations from Dr. Fuller Plan after time procedure.  Ellouise Newer, PA-C [facility] 08/11/2019, 10:34 AM  Cc: [name], MD

## 2019-08-11 NOTE — Patient Instructions (Addendum)
Start Miralax once daily- dissolve 17grams or 1 capful in at least 8 ounces of water or juice daily.   Prednisone Taper instructions- Continue 66m of prednisone through March 23rd.   Starting on March 24th- Take (6 tablets) 321mfor 1 week, then decrease to 2517m tablets) for 1 week, then decrease to 65m53mtablets) for 1 week, then decrease to 15mg32mablets) for 1 week, then decrease to 10mg(62mblets) for 1 week , then decrease to 5mg(1 40mlet) for 1 week and then stop the prednisone.      Stop dicyclomine.   Please see low fiber/low residue diet.   We have sent the following medications to your pharmacy for you to pick up at your convenience: Suprep/Prednisone    You have been scheduled for a colonoscopy. Please follow written instructions given to you at your visit today.  Please pick up your prep supplies at the pharmacy within the next 1-3 days. If you use inhalers (even only as needed), please bring them with you on the day of your procedure.  Thank you for choosing me and [facility].  JennifeDennison Bulla

## 2019-08-11 NOTE — Progress Notes (Signed)
Reviewed and agree with management plan.  Vyom Brass T. Tammy Ericsson, MD FACG [facility]  

## 2019-08-11 NOTE — Addendum Note (Signed)
Addended by: Debbe Mounts on: 08/11/2019 11:30 AM   Modules accepted: Orders

## 2019-08-17 ENCOUNTER — Ambulatory Visit (INDEPENDENT_AMBULATORY_CARE_PROVIDER_SITE_OTHER): Payer: Medicare Other

## 2019-08-17 ENCOUNTER — Other Ambulatory Visit: Payer: Self-pay | Admitting: Gastroenterology

## 2019-08-17 DIAGNOSIS — Z1159 Encounter for screening for other viral diseases: Secondary | ICD-10-CM

## 2019-08-18 LAB — SARS CORONAVIRUS 2 (TAT 6-24 HRS): SARS Coronavirus 2: NEGATIVE

## 2019-08-19 ENCOUNTER — Other Ambulatory Visit: Payer: Self-pay

## 2019-08-19 ENCOUNTER — Encounter: Payer: Self-pay | Admitting: Gastroenterology

## 2019-08-19 ENCOUNTER — Ambulatory Visit (AMBULATORY_SURGERY_CENTER): Payer: Medicare Other | Admitting: Gastroenterology

## 2019-08-19 VITALS — BP 114/66 | HR 71 | Temp 97.3°F | Resp 18 | Ht 64.0 in | Wt 150.0 lb

## 2019-08-19 DIAGNOSIS — D120 Benign neoplasm of cecum: Secondary | ICD-10-CM

## 2019-08-19 DIAGNOSIS — K5730 Diverticulosis of large intestine without perforation or abscess without bleeding: Secondary | ICD-10-CM | POA: Diagnosis not present

## 2019-08-19 DIAGNOSIS — K56699 Other intestinal obstruction unspecified as to partial versus complete obstruction: Secondary | ICD-10-CM | POA: Diagnosis not present

## 2019-08-19 DIAGNOSIS — R933 Abnormal findings on diagnostic imaging of other parts of digestive tract: Secondary | ICD-10-CM

## 2019-08-19 MED ORDER — SODIUM CHLORIDE 0.9 % IV SOLN: 500.0000 mL | INTRAVENOUS | Status: DC

## 2019-08-19 NOTE — Progress Notes (Signed)
Report given to PACU, vss 

## 2019-08-19 NOTE — Op Note (Signed)
[place] Patient Name: Carrie Watkins Procedure Date: 08/19/2019 1:42 PM MRN: [id number] Endoscopist: Ladene Artist , MD Age: 74 Referring MD:  Date of Birth: [birth year] Gender: Female Account #: [id number] Procedure:                Colonoscopy Indications:              Abnormal CT of the GI tract (terminal ileum) and                            recent partial SBO Medicines:                Monitored Anesthesia Care Procedure:                Pre-Anesthesia Assessment:                           - Prior to the procedure, a History and Physical                            was performed, and patient medications and                            allergies were reviewed. The patient's tolerance of                            previous anesthesia was also reviewed. The risks                            and benefits of the procedure and the sedation                            options and risks were discussed with the patient.                            All questions were answered, and informed consent                            was obtained. Prior Anticoagulants: The patient has                            taken no previous anticoagulant or antiplatelet                            agents. ASA Grade Assessment: II - A patient with                            mild systemic disease. After reviewing the risks                            and benefits, the patient was deemed in                            satisfactory condition to undergo the procedure.  After obtaining informed consent, the colonoscope                            was passed under direct vision. Throughout the                            procedure, the patient's blood pressure, pulse, and                            oxygen saturations were monitored continuously. The                            Colonoscope was introduced through the anus and                            advanced to the the cecum, identified by                             appendiceal orifice and ileocecal valve. The                            ileocecal valve, appendiceal orifice, and rectum                            were photographed. The quality of the bowel                            preparation was good. The colonoscopy was performed                            without difficulty. The patient tolerated the                            procedure well. Scope In: 1:47:43 PM Scope Out: 2:08:08 PM Scope Withdrawal Time: 0 hours 17 minutes 25 seconds  Total Procedure Duration: 0 hours 20 minutes 25 seconds  Findings:                 The perianal and digital rectal examinations were                            normal.                           The terminal ileum contained a benign-appearing,                            intrinsic moderate stenosis measuring less than one                            cm (in length) x 1 cm (inner diameter) that was                            traversed. The stenosis has a small linear  ulceration and it was located about 20-25 cm from                            the IC valve. Biopsies were taken with a cold                            forceps for histology.                           The remainder of the exam in the terminal ileum                            proximal and distal to the stricture appeared                            normal. Random biopsies obtained.                           A 7 mm polyp was found in the cecum. The polyp was                            sessile. The polyp was removed with a cold snare.                            Resection and retrieval were complete.                           Multiple small-mouthed diverticula were found in                            the left colon. There was no evidence of                            diverticular bleeding.                           The exam was otherwise without abnormality on                            direct and  retroflexion views. Random biopsies                            obtained. Complications:            No immediate complications. Estimated blood loss:                            None. Estimated Blood Loss:     Estimated blood loss: none. 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 Biopsied.                           -  One 7 mm polyp in the cecum, removed with a cold                            snare. Resected and retrieved.                           - Moderate diverticulosis in the left colon.                           - The examination was otherwise normal on direct                            and retroflexion views. Random biopises obtained. Recommendation:           - Repeat colonoscopy after studies are complete for                            surveillance based on pathology results.                           - Patient has a contact number available for                            emergencies. The signs and symptoms of potential                            delayed complications were discussed with the                            patient. Return to normal activities tomorrow.                            Written discharge instructions were provided to the                            patient.                           - Resume previous soft diet.                           - Continue present medications.                           - Await pathology results.                           - Return to GI office in 2 weeks with me of                            Ellouise Newer, PA-C. Ladene Artist, MD 08/19/2019 2:22:03 PM This report has been signed electronically.

## 2019-08-19 NOTE — Patient Instructions (Signed)
Handouts given for polyps and diverticulosis.  Resume previous soft diet.  Return to office with Dr. Fuller Plan in 2 weeks.YOU HAD AN ENDOSCOPIC PROCEDURE TODAY AT [facility]:   Refer to the procedure report that was given to you for any specific questions about what was found during the examination.  If the procedure report does not answer your questions, please call your gastroenterologist to clarify.  If you requested that your care partner not be given the details of your procedure findings, then the procedure report has been included in a sealed envelope for you to review at your convenience later.  YOU SHOULD EXPECT: Some feelings of bloating in the abdomen. Passage of more gas than usual.  Walking can help get rid of the air that was put into your GI tract during the procedure and reduce the bloating. If you had a lower endoscopy (such as a colonoscopy or flexible sigmoidoscopy) you may notice spotting of blood in your stool or on the toilet paper. If you underwent a bowel prep for your procedure, you may not have a normal bowel movement for a few days.  Please Note:  You might notice some irritation and congestion in your nose or some drainage.  This is from the oxygen used during your procedure.  There is no need for concern and it should clear up in a day or so.  SYMPTOMS TO REPORT IMMEDIATELY:   Following lower endoscopy (colonoscopy or flexible sigmoidoscopy):  Excessive amounts of blood in the stool  Significant tenderness or worsening of abdominal pains  Swelling of the abdomen that is new, acute  Fever of 100F or higher  For urgent or emergent issues, a gastroenterologist can be reached at any hour by calling [phone]. Do not use MyChart messaging for urgent concerns.    DIET:  We do recommend a small meal at first, but then you may proceed to your regular diet.  Drink plenty of fluids but you should avoid alcoholic beverages for 24 hours.  ACTIVITY:  You  should plan to take it easy for the rest of today and you should NOT DRIVE or use heavy machinery until tomorrow (because of the sedation medicines used during the test).    FOLLOW UP: Our staff will call the number listed on your records 48-72 hours following your procedure to check on you and address any questions or concerns that you may have regarding the information given to you following your procedure. If we do not reach you, we will leave a message.  We will attempt to reach you two times.  During this call, we will ask if you have developed any symptoms of COVID 19. If you develop any symptoms (ie: fever, flu-like symptoms, shortness of breath, cough etc.) before then, please call [phone].  If you test positive for Covid 19 in the 2 weeks post procedure, please call and report this information to [place].    If any biopsies were taken you will be contacted by phone or by letter within the next 1-3 weeks.  Please call us at [phone] if you have not heard about the biopsies in 3 weeks.    SIGNATURES/CONFIDENTIALITY: You and/or your care partner have signed paperwork which will be entered into your electronic medical record.  These signatures attest to the fact that that the information above on your After Visit Summary has been reviewed and is understood.  Full responsibility of the confidentiality of this discharge information lies with you and/or your  care-partner.

## 2019-08-19 NOTE — Progress Notes (Signed)
Called to room to assist during endoscopic procedure.  Patient ID and intended procedure confirmed with present staff. Received instructions for my participation in the procedure from the performing physician.  

## 2019-08-19 NOTE — Progress Notes (Signed)
Pt's states no medical or surgical changes since previsit or office visit.   CW vitals, BC IV and Af temp.

## 2019-08-21 ENCOUNTER — Telehealth: Payer: Self-pay

## 2019-08-21 NOTE — Telephone Encounter (Signed)
  Follow up Call-  Call back number 08/19/2019  Post procedure Call Back phone  # [phone]  Permission to leave phone message Yes  Some recent data might be hidden     Patient questions:  Do you have a fever, pain , or abdominal swelling? No. Pain Score  0 *  Have you tolerated food without any problems? Yes.    Have you been able to return to your normal activities? Yes.    Do you have any questions about your discharge instructions: Diet   No. Medications  No. Follow up visit  No.  Do you have questions or concerns about your Care? No.  Actions: * If pain score is 4 or above: No action needed, pain <4.  1. Have you developed a fever since your procedure? no  2.   Have you had an respiratory symptoms (SOB or cough) since your procedure? no  3.   Have you tested positive for COVID 19 since your procedure no  4.   Have you had any family members/close contacts diagnosed with the COVID 19 since your procedure?  no   If yes to any of these questions please route to Joylene John, RN and Erenest Rasher, RN

## 2019-08-26 ENCOUNTER — Encounter: Payer: Self-pay | Admitting: Gastroenterology

## 2019-08-27 ENCOUNTER — Telehealth: Payer: Self-pay | Admitting: Gastroenterology

## 2019-08-27 MED ORDER — ONDANSETRON 4 MG PO TBDP: 4.0000 mg | ORAL_TABLET | Freq: Three times a day (TID) | ORAL | 0 refills | Status: DC | PRN

## 2019-08-27 NOTE — Telephone Encounter (Signed)
Patient continued to have intermittent nausea.  I will send her in a refill of her zofran.  She also reports loose stools this am.  She has a hx of small bowel obstruction.  She denies pain or other complaints today.  She will start on a soft bland diet and progress as she tolerates.  She will call back for any additional questions or concerns. She is asked to keep her follow up with Ellouise Newer, PA on 4/14

## 2019-09-08 ENCOUNTER — Ambulatory Visit (INDEPENDENT_AMBULATORY_CARE_PROVIDER_SITE_OTHER): Payer: Medicare Other | Admitting: Gastroenterology

## 2019-09-08 ENCOUNTER — Encounter: Payer: Self-pay | Admitting: Gastroenterology

## 2019-09-08 VITALS — BP 140/70 | HR 68 | Temp 97.4°F | Ht 63.0 in | Wt 150.0 lb

## 2019-09-08 DIAGNOSIS — K56699 Other intestinal obstruction unspecified as to partial versus complete obstruction: Secondary | ICD-10-CM

## 2019-09-08 NOTE — Progress Notes (Signed)
    History of Present Illness: This is a 74 year old female returning for follow-up of ileal stricture.  She has maintained a soft diet and has felt very well without GI problems other than her ongoing mild IBS with alternating normal to diarrhea pattern.  Terminal ileal stricture with linear ulceration noted at colonoscopy however biopsies were entirely normal.  Random terminal ileal biopsies were normal.  Random colon biopsies were normal.   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 Biopsied. - One 7 mm polyp in the cecum, removed with a cold snare. Resected and retrieved. - Moderate diverticulosis in the left colon. - The examination was otherwise normal on direct and retroflex views.  1. Surgical [P], colon, cecum, polyp - TUBULAR ADENOMA (1 OF 3 FRAGMENTS) - BENIGN COLONIC MUCOSA (2 OF 3 FRAGMENTS) - NO HIGH GRADE DYSPLASIA OR MALIGNANCY IDENTIFIED 2. Surgical [P], small bowel, terminal ileum - BENIGN SMALL BOWEL MUCOSA - NO ACUTE INFLAMMATION, GRANULOMAS OR MALIGNANCY IDENTIFIED 3. Surgical [P], small bowel, terminal ileum stricture - BENIGN SMALL BOWEL MUCOSA - NO ACUTE INFLAMMATION, GRANULOMAS OR MALIGNANCY IDENTIFIED 4. Surgical [P], random colon sites - BENIGN COLONIC MUCOSA - NO ACTIVE INFLAMMATION OR EVIDENCE OF MICROSCOPIC COLITIS - NO HIGH GRADE DYSPLASIA OR MALIGNANCY IDENTIFIED  Current Medications, Allergies, Past Medical History, Past Surgical History, Family History and Social History were reviewed in [employer] record.   Physical Exam: General: Well developed, well nourished, no acute distress Head: Normocephalic and atraumatic Eyes:  sclerae anicteric, EOMI Ears: Normal auditory acuity Mouth: Not examined, mask on during Covid-19 pandemic Lungs: Clear throughout to auscultation Heart: Regular rate and rhythm; no murmurs, rubs or bruits Abdomen: Soft, non tender and non distended. No masses,  hepatosplenomegaly or hernias noted. Normal Bowel sounds Rectal: Not done Musculoskeletal: Symmetrical with no gross deformities  Pulses:  Normal pulses noted Extremities: No clubbing, cyanosis, edema or deformities noted Neurological: Alert oriented x 4, grossly nonfocal Psychological:  Alert and cooperative. Normal mood and affect   Assessment and Recommendations:  1.  Terminal ileal stricture with linear ulceration.  History of partial small bowel obstruction.  Crohn's ileitis with stricturing was suspected however this was not demonstrated on pathology.  No history of significant NSAIDs, potassium or other medications that may lead to small bowel injury.  No evidence to suggest mesenteric ischemia.  This may still represent Crohn's disease despite negative biopsies.  Complete prednisone taper as described.  Discontinue omeprazole when prednisone taper is completed. Advised to avoid seeds, nuts, popcorn, corn.  Consider colonoscopy with balloon dilation of stricture or surgical resection if she has recurrent partial small bowel obstructions.  REV in 2 month.   2.  IBS.  Bowel pattern alternating between normal to loose.  Avoid foods that exacerbate symptoms.  Dicyclomine 10 mg 3 times daily as needed.  3.  Intermittent nausea.  Etiology unclear.  Continue Zofran 4 mg 3 times daily as needed.  Reserve Phenergan for more severe episodes however this leads to drowsiness.    4.  Personal history of adenomatous colon polyps.  A 7-year interval surveillance colonoscopy is recommended in March 2028.

## 2019-09-08 NOTE — Patient Instructions (Signed)
Complete prednisone taper as prescribed.   Thank you for choosing me and [facility].  Pricilla Riffle. Dagoberto Ligas., MD., Mar[facility] Regal

## 2019-09-30 ENCOUNTER — Emergency Department (HOSPITAL_BASED_OUTPATIENT_CLINIC_OR_DEPARTMENT_OTHER)
Admission: EM | Admit: 2019-09-30 | Discharge: 2019-09-30 | Disposition: A | Discharge status: Home / Self Care | Payer: Medicare Other | Attending: Emergency Medicine | Admitting: Emergency Medicine

## 2019-09-30 ENCOUNTER — Encounter (HOSPITAL_BASED_OUTPATIENT_CLINIC_OR_DEPARTMENT_OTHER): Payer: Self-pay | Admitting: Emergency Medicine

## 2019-09-30 ENCOUNTER — Emergency Department (HOSPITAL_BASED_OUTPATIENT_CLINIC_OR_DEPARTMENT_OTHER): Payer: Medicare Other

## 2019-09-30 ENCOUNTER — Other Ambulatory Visit: Payer: Self-pay

## 2019-09-30 DIAGNOSIS — I10 Essential (primary) hypertension: Secondary | ICD-10-CM | POA: Diagnosis not present

## 2019-09-30 DIAGNOSIS — Z79899 Other long term (current) drug therapy: Secondary | ICD-10-CM | POA: Diagnosis not present

## 2019-09-30 DIAGNOSIS — Y929 Unspecified place or not applicable: Secondary | ICD-10-CM | POA: Insufficient documentation

## 2019-09-30 DIAGNOSIS — W010XXA Fall on same level from slipping, tripping and stumbling without subsequent striking against object, initial encounter: Secondary | ICD-10-CM | POA: Diagnosis not present

## 2019-09-30 DIAGNOSIS — Z9049 Acquired absence of other specified parts of digestive tract: Secondary | ICD-10-CM | POA: Insufficient documentation

## 2019-09-30 DIAGNOSIS — S39012A Strain of muscle, fascia and tendon of lower back, initial encounter: Secondary | ICD-10-CM

## 2019-09-30 DIAGNOSIS — E039 Hypothyroidism, unspecified: Secondary | ICD-10-CM | POA: Diagnosis not present

## 2019-09-30 DIAGNOSIS — S3992XA Unspecified injury of lower back, initial encounter: Secondary | ICD-10-CM | POA: Diagnosis present

## 2019-09-30 DIAGNOSIS — Y999 Unspecified external cause status: Secondary | ICD-10-CM | POA: Insufficient documentation

## 2019-09-30 DIAGNOSIS — E119 Type 2 diabetes mellitus without complications: Secondary | ICD-10-CM | POA: Insufficient documentation

## 2019-09-30 DIAGNOSIS — Z8616 Personal history of COVID-19: Secondary | ICD-10-CM | POA: Diagnosis not present

## 2019-09-30 DIAGNOSIS — Y939 Activity, unspecified: Secondary | ICD-10-CM | POA: Insufficient documentation

## 2019-09-30 DIAGNOSIS — Z7984 Long term (current) use of oral hypoglycemic drugs: Secondary | ICD-10-CM | POA: Diagnosis not present

## 2019-09-30 DIAGNOSIS — Z87891 Personal history of nicotine dependence: Secondary | ICD-10-CM | POA: Insufficient documentation

## 2019-09-30 DIAGNOSIS — W19XXXA Unspecified fall, initial encounter: Secondary | ICD-10-CM

## 2019-09-30 LAB — COMPREHENSIVE METABOLIC PANEL
ALT: 18 U/L (ref 0–44)
AST: 19 U/L (ref 15–41)
Albumin: 3.8 g/dL (ref 3.5–5.0)
Alkaline Phosphatase: 83 U/L (ref 38–126)
Anion gap: 10 (ref 5–15)
BUN: 7 mg/dL — ABNORMAL LOW (ref 8–23)
CO2: 23 mmol/L (ref 22–32)
Calcium: 8.9 mg/dL (ref 8.9–10.3)
Chloride: 104 mmol/L (ref 98–111)
Creatinine, Ser: 0.73 mg/dL (ref 0.44–1.00)
GFR calc Af Amer: >60 mL/min (ref >60)
GFR calc non Af Amer: >60 mL/min (ref >60)
Glucose, Bld: 102 mg/dL — ABNORMAL HIGH (ref 70–99)
Potassium: 3.8 mmol/L (ref 3.5–5.1)
Sodium: 137 mmol/L (ref 135–145)
Total Bilirubin: 0.5 mg/dL (ref 0.3–1.2)
Total Protein: 6.9 g/dL (ref 6.5–8.1)

## 2019-09-30 LAB — CBC WITH DIFFERENTIAL/PLATELET
Abs Immature Granulocytes: 0.02 10*3/uL (ref 0.00–0.07)
Basophils Absolute: 0 10*3/uL (ref 0.0–0.1)
Basophils Relative: 1 %
Eosinophils Absolute: 0.1 10*3/uL (ref 0.0–0.5)
Eosinophils Relative: 2 %
HCT: 36.3 % (ref 36.0–46.0)
Hemoglobin: 12.1 g/dL (ref 12.0–15.0)
Immature Granulocytes: 0 %
Lymphocytes Relative: 19 %
Lymphs Abs: 1.2 10*3/uL (ref 0.7–4.0)
MCH: 30 pg (ref 26.0–34.0)
MCHC: 33.3 g/dL (ref 30.0–36.0)
MCV: 89.9 fL (ref 80.0–100.0)
Monocytes Absolute: 0.6 10*3/uL (ref 0.1–1.0)
Monocytes Relative: 10 %
Neutro Abs: 4.3 10*3/uL (ref 1.7–7.7)
Neutrophils Relative %: 68 %
Platelets: 214 10*3/uL (ref 150–400)
RBC: 4.04 MIL/uL (ref 3.87–5.11)
RDW: 12.8 % (ref 11.5–15.5)
WBC: 6.3 10*3/uL (ref 4.0–10.5)
nRBC: 0 % (ref 0.0–0.2)

## 2019-09-30 LAB — URINALYSIS, ROUTINE W REFLEX MICROSCOPIC
Bilirubin Urine: NEGATIVE
Glucose, UA: NEGATIVE mg/dL
Hgb urine dipstick: NEGATIVE
Ketones, ur: NEGATIVE mg/dL
Nitrite: NEGATIVE
Protein, ur: NEGATIVE mg/dL
Specific Gravity, Urine: 1.02 (ref 1.005–1.030)
pH: 5.5 (ref 5.0–8.0)

## 2019-09-30 LAB — URINALYSIS, MICROSCOPIC (REFLEX)

## 2019-09-30 LAB — LIPASE, BLOOD: Lipase: 30 U/L (ref 11–51)

## 2019-09-30 MED ORDER — OXYCODONE-ACETAMINOPHEN 5-325 MG PO TABS
1.0000 | ORAL_TABLET | Freq: Once | ORAL | Status: AC
  Administered 2019-09-30: 13:00:00 1 via ORAL
  Filled 2019-09-30: qty 1

## 2019-09-30 MED ORDER — HYDROCODONE-ACETAMINOPHEN 5-325 MG PO TABS: 1.0000 | ORAL_TABLET | Freq: Four times a day (QID) | ORAL | 0 refills | Status: AC | PRN

## 2019-09-30 MED ORDER — IOHEXOL 300 MG/ML  SOLN
100.0000 mL | Freq: Once | INTRAMUSCULAR | Status: AC | PRN
  Administered 2019-09-30: 100 mL via INTRAVENOUS

## 2019-09-30 NOTE — ED Provider Notes (Addendum)
[facility] EMERGENCY DEPARTMENT Provider Note   CSN: [id number] Arrival date & time: 09/30/19  1026     History Chief Complaint  Patient presents with  . Fall    Carrie Watkins is a 74 y.o. female.  Presents to ER with chief complaint of low back pain, low abdominal pain.  States pain seems to radiate down both legs.  No leg swelling or skin color changes noted.  Has been ambulatory without difficulty.  No numbness, weakness, bladder or bowel incontinence.  Thinks she may have fell on about a week ago but did not recall any major injuries at that time.  No head trauma, no headache.  No vomiting or nausea.  Pain is currently dull achy and throbbing.  Reports she has Rx for Tylenol 3 which does help with pain but seems to make her too groggy to function during the day.  Reports recently went to her gynecologist diagnosed her with UTI for which she has been taking antibiotic as prescribed.  Back in March patient had partial small bowel obstruction.  Past surgical history cholecystectomy.  Past medical history notable for diabetes, GERD, hypertension.  HPI     Past Medical History:  Diagnosis Date  . Anemia   . Diabetes mellitus without complication ([place])   . GERD (gastroesophageal reflux disease)   . GI bleed   . High cholesterol   . Hypertension   . Thyroid disease   . Tubular adenoma of colon 07/2019    Patient Active Problem List   Diagnosis Date Noted  . Partial small bowel obstruction ([place]) 07/28/2019  . COVID-19 virus infection 06/20/2019  . Small bowel obstruction, partial ([place]) 06/19/2019  . Obstruction of bowel ([place]) 06/19/2019  . Abnormal computed tomography of cecum and terminal ileum 06/19/2019  . Abdominal pain 06/19/2019  . Nausea and vomiting 06/19/2019  . Mixed hyperlipidemia 02/04/2015  . Vitamin D deficiency 04/01/2014  . Diabetes mellitus type 2, controlled, without complications ([place]) 91/91/6606  . Essential hypertension 01/13/2014  .  Hypothyroidism 01/13/2014    Past Surgical History:  Procedure Laterality Date  . CHOLECYSTECTOMY       OB History   No obstetric history on file.     Family History  Problem Relation Age of Onset  . Heart failure Mother   . Colon cancer Neg Hx   . Esophageal cancer Neg Hx   . Rectal cancer Neg Hx   . Stomach cancer Neg Hx     Social History   Tobacco Use  . Smoking status: Former Research scientist (life sciences)  . Smokeless tobacco: Never Used  Substance Use Topics  . Alcohol use: No  . Drug use: Never    Home Medications Prior to Admission medications   Medication Sig Start Date End Date Taking? Authorizing Provider  acetaminophen (TYLENOL) 325 MG tablet Take 650 mg by mouth every 6 (six) hours as needed for mild pain or headache.    [provider]  ALPRAZolam Duanne Moron) 0.5 MG tablet Take 0.5 mg by mouth 2 (two) times daily. 04/29/19   [provider]  Ascorbic Acid (VITAMIN C PO) Take 1 tablet by mouth daily.    [provider]  Bacillus Coagulans-Inulin (ALIGN PREBIOTIC-PROBIOTIC) 5-1.25 MG-GM CHEW Chew 1 tablet by mouth daily. 08/08/19   Isla Pence, MD  cholecalciferol (VITAMIN D3) 25 MCG (1000 UNIT) tablet Take 1,000 Units by mouth daily.    [provider]  dicyclomine (BENTYL) 10 MG capsule Take 1 capsule (10 mg total) by  mouth 3 (three) times daily as needed for spasms. 03/03/17   Charlesetta Shanks, MD  HYDROcodone-acetaminophen (NORCO/VICODIN) 5-325 MG tablet Take 1 tablet by mouth every 4 (four) hours as needed. 08/08/19   Isla Pence, MD  HYDROcodone-acetaminophen (NORCO/VICODIN) 5-325 MG tablet Take 1 tablet by mouth every 6 (six) hours as needed for up to 3 days for severe pain. 09/30/19 10/03/19  Lucrezia Starch, MD  levothyroxine (SYNTHROID) 112 MCG tablet Take 112 mcg by mouth every morning. 04/27/19   [provider]  LORazepam (ATIVAN) 0.5 MG tablet Take 0.5 mg by mouth every 8 (eight) hours as needed for anxiety.     [provider]  meclizine (ANTIVERT) 25 MG tablet Take 25 mg by mouth 2 (two) times daily as needed for dizziness.  06/26/19   [provider]  metFORMIN (GLUCOPHAGE-XR) 500 MG 24 hr tablet Take 1,000 mg by mouth daily. 04/06/19   [provider]  Multiple Vitamins-Minerals (ZINC PO) Take 1 tablet by mouth daily.    [provider]  nortriptyline (PAMELOR) 10 MG capsule Take 10 mg by mouth at bedtime. 07/10/19   [provider]  omeprazole (PRILOSEC) 40 MG capsule Take 40 mg by mouth daily.    [provider]  ondansetron (ZOFRAN ODT) 4 MG disintegrating tablet Take 1 tablet (4 mg total) by mouth every 8 (eight) hours as needed for nausea or vomiting. 08/27/19   Ladene Artist, MD  predniSONE (DELTASONE) 5 MG tablet Starting on March 24th- Take (6 tablets) 78m for 1 week, then decrease to 271m5 tablets) for 1 week, then decrease to 2054m tablets) for 1 week, then decrease to 21m83mtablets) for 1 week, then decrease to 10mg37mablets) for 1 week , then decrease to 5mg(139mblet) for 1 week and then stop the prednisone. 08/11/19   LemmonLevin Erppromethazine (PHENERGAN) 12.5 MG tablet Take 1 tablet (12.5 mg total) by mouth every 6 (six) hours as needed for nausea or vomiting. 08/08/19   HavilaIsla Pencesertraline (ZOLOFT) 50 MG tablet Take 50 mg by mouth daily. 04/24/19   [provider]  simvastatin (ZOCOR) 10 MG tablet Take 10 mg by mouth daily.    [provider]  valsartan (DIOVAN) 160 MG tablet Take 160 mg by mouth daily. 03/23/19   [provider]    Allergies    Ciprofloxacin, Prozac [fluoxetine hcl], and Hydralazine  Review of Systems   Review of Systems  Constitutional: Negative for chills and fever.  HENT: Negative for ear pain and sore throat.   Eyes: Negative for pain and visual disturbance.  Respiratory: Negative for cough and shortness of breath.   Cardiovascular: Negative for chest pain and  palpitations.  Gastrointestinal: Positive for abdominal pain. Negative for vomiting.  Genitourinary: Negative for dysuria and hematuria.  Musculoskeletal: Positive for back pain. Negative for arthralgias.  Skin: Negative for color change and rash.  Neurological: Negative for seizures and syncope.  All other systems reviewed and are negative.   Physical Exam Updated Vital Signs BP (!) 156/71 (BP Location: Right Arm)   Pulse 65   Temp 98.1 F (36.7 C) (Oral)   Resp 16   Ht 5' 4"  (1.626 m)   Wt 68 kg   SpO2 98%   BMI 25.75 kg/m   Physical Exam Vitals and nursing note reviewed.  Constitutional:      General: She is not in acute distress.    Appearance: She is well-developed.  HENT:  Head: Normocephalic and atraumatic.  Eyes:     Conjunctiva/sclera: Conjunctivae normal.  Cardiovascular:     Rate and Rhythm: Normal rate and regular rhythm.     Heart sounds: No murmur.  Pulmonary:     Effort: Pulmonary effort is normal. No respiratory distress.     Breath sounds: Normal breath sounds.  Abdominal:     Palpations: Abdomen is soft.     Tenderness: There is no abdominal tenderness.  Musculoskeletal:     Cervical back: Neck supple.     Comments: Back: Some tenderness over lower L-spine but no step-off or deformity, no T, L spine TTP RUE: no TTP throughout, no deformity, normal joint ROM, radial pulse intact, distal sensation and motor intact LUE: no TTP throughout, no deformity, normal joint ROM, radial pulse intact, distal sensation and motor intact RLE: Mild tenderness to upper thigh but, no deformity, normal joint ROM, distal pulse, sensation and motor intact LLE: Mild tenderness to upper thigh but, no deformity, normal joint ROM, distal pulse, sensation and motor intact  Skin:    General: Skin is warm and dry.  Neurological:     General: No focal deficit present.     Mental Status: She is alert.     ED Results / Procedures / Treatments   Labs (all labs ordered are  listed, but only abnormal results are displayed) Labs Reviewed  COMPREHENSIVE METABOLIC PANEL - Abnormal; Notable for the following components:      Result Value   Glucose, Bld 102 (*)    BUN 7 (*)    All other components within normal limits  URINALYSIS, ROUTINE W REFLEX MICROSCOPIC - Abnormal; Notable for the following components:   Leukocytes,Ua TRACE (*)    All other components within normal limits  URINALYSIS, MICROSCOPIC (REFLEX) - Abnormal; Notable for the following components:   Bacteria, UA RARE (*)    All other components within normal limits  CBC WITH DIFFERENTIAL/PLATELET  LIPASE, BLOOD    EKG None  Radiology CT ABDOMEN PELVIS W CONTRAST  Result Date: 09/30/2019 CLINICAL DATA:  Abdominal pain, back pain, bilateral leg pain, diarrhea, constipation and nausea since last week. EXAM: CT ABDOMEN AND PELVIS WITH CONTRAST TECHNIQUE: Multidetector CT imaging of the abdomen and pelvis was performed using the standard protocol following bolus administration of intravenous contrast. CONTRAST:  157m OMNIPAQUE IOHEXOL 300 MG/ML  SOLN COMPARISON:  CT abdomen/pelvis dated 08/08/2019, 07/28/2019 and 06/19/2019. FINDINGS: Lower chest: No acute findings. Mild scarring at the LEFT lung base. Hepatobiliary: Scattered hepatic cysts. No acute or suspicious finding within the liver. Status post cholecystectomy with commensurate bile duct ectasia. Pancreas: Unremarkable. No pancreatic ductal dilatation or surrounding inflammatory changes. Spleen: Normal in size without focal abnormality. Adrenals/Urinary Tract: Adrenal glands appear normal. Kidneys are unremarkable without suspicious mass, stone or hydronephrosis. No ureteral or bladder calculi. Bladder appears normal. Stomach/Bowel: No dilated large or small bowel loops. Extensive diverticulosis of the sigmoid colon, with additional diverticulosis scattered throughout the remainder of the colon, but no focal inflammatory change to suggest acute  diverticulitis at this time. No evidence of active bowel wall inflammation within the large or small bowel. Stomach is unremarkable, partially decompressed. Again noted is fatty replacement of the walls of the distal small bowel suggesting a chronic underlying inflammatory bowel disease. No evidence of associated bowel obstruction on today's exam. Appendix appears normal. Vascular/Lymphatic: Aortic atherosclerosis. Reproductive: Stable LEFT ovarian cyst, measuring 3.9 cm. Other: No free fluid or abscess collection. No free intraperitoneal air. Musculoskeletal: No acute  or suspicious osseous finding. Mild degenerative spondylosis of the lumbar spine. IMPRESSION: 1. No acute findings within the abdomen or pelvis. No bowel obstruction or evidence of acute/active bowel wall inflammation. No evidence of acute solid organ abnormality. No renal or ureteral calculi. Appendix is normal. 2. Colonic diverticulosis without evidence of acute diverticulitis at this time. 3. Fatty replacement of the walls of the distal small bowel suggesting a chronic underlying inflammatory bowel disease, as previously described and similar to previous exams. No evidence of associated bowel obstruction on today's exam. 4. Stable LEFT ovarian cyst, measuring 3.9 cm. Consider follow-up pelvic ultrasound at some point to ensure benignity. Aortic Atherosclerosis (ICD10-I70.0). Electronically Signed   By: Franki Cabot M.D.   On: 09/30/2019 15:05    Procedures Procedures (including critical care time)  Medications Ordered in ED Medications  oxyCODONE-acetaminophen (PERCOCET/ROXICET) 5-325 MG per tablet 1 tablet (1 tablet Oral Given 09/30/19 1302)  iohexol (OMNIPAQUE) 300 MG/ML solution 100 mL (100 mLs Intravenous Contrast Given 09/30/19 1436)    ED Course  I have reviewed the triage vital signs and the nursing notes.  Pertinent labs & imaging results that were available during my care of the patient were reviewed by me and considered in my  medical decision making (see chart for details).    MDM Rules/Calculators/A&P                      74 year old lady presenting to ER with lower abdominal pain, lower back pain radiating down both legs.  Patient was well-appearing with normal vital signs, lab work was grossly within normal limits.  Patient reported possible fall recently, has history of partial SBO in March, obtain CT to further evaluate, CT abdomen pelvis negative for any acute trauma or any acute abdominal process, L ovarian cyst unchanged in size from prior CTs.  On reassessment patient remains well, ambulatory without difficulty.  Given the a forementioned work-up believe patient can be discharged home.  Recommended recheck with primary doctor, return for worsening symptoms.    After the discussed management above, the patient was determined to be safe for discharge.  The patient was in agreement with this plan and all questions regarding their care were answered.  ED return precautions were discussed and the patient will return to the ED with any significant worsening of condition.  Final Clinical Impression(s) / ED Diagnoses Final diagnoses:  Fall, initial encounter  Strain of lumbar region, initial encounter    Rx / DC Orders ED Discharge Orders         Ordered    HYDROcodone-acetaminophen (NORCO/VICODIN) 5-325 MG tablet  Every 6 hours PRN     09/30/19 1545           Lucrezia Starch, MD 10/01/19 5597    Lucrezia Starch, MD 10/01/19 [phone]

## 2019-09-30 NOTE — ED Triage Notes (Signed)
Pt fell one week ago.  Having bilateral leg pain and lower back pain.  Seen recently and treated for UTI.

## 2019-09-30 NOTE — Discharge Instructions (Signed)
Can take the prescribed hydrocodone as needed for pain.  Note this can make you drowsy shot be taken with driving or operating heavy machinery.  Recommend close recheck with your primary doctor regarding symptoms you are experiencing today.  If you develop worsening pain, swelling, vomiting, other new concerning symptom recommend return to ER for reassessment.

## 2019-11-10 ENCOUNTER — Encounter: Payer: Self-pay | Admitting: Gastroenterology

## 2019-11-10 ENCOUNTER — Ambulatory Visit (INDEPENDENT_AMBULATORY_CARE_PROVIDER_SITE_OTHER): Payer: Medicare Other | Admitting: Gastroenterology

## 2019-11-10 ENCOUNTER — Other Ambulatory Visit (INDEPENDENT_AMBULATORY_CARE_PROVIDER_SITE_OTHER): Payer: Medicare Other

## 2019-11-10 VITALS — BP 140/66 | HR 92 | Ht 63.0 in | Wt 154.5 lb

## 2019-11-10 DIAGNOSIS — K56699 Other intestinal obstruction unspecified as to partial versus complete obstruction: Secondary | ICD-10-CM

## 2019-11-10 LAB — CREATININE, SERUM: Creatinine, Ser: 0.79 mg/dL (ref 0.40–1.20)

## 2019-11-10 LAB — BUN: BUN: 10 mg/dL (ref 6–23)

## 2019-11-10 NOTE — Progress Notes (Signed)
    History of Present Illness: This is a 74 year old female returning for follow-up of terminal ileum stricture and a history of partial small bowel obstruction.  She has mild IBS symptoms with normal to diarrhea alternating pattern. She has maintained a soft diet.  She completed prednisone taper about 6 weeks ago.  She has no other gastrointestinal complaints.  CBC and CMP on April 12 were normal.  Current Medications, Allergies, Past Medical History, Past Surgical History, Family History and Social History were reviewed in [employer] record.   Physical Exam: General: Well developed, well nourished, no acute distress Head: Normocephalic and atraumatic Eyes:  sclerae anicteric, EOMI Ears: Normal auditory acuity Mouth: Not examined, mask on during Covid-19 pandemic Lungs: Clear throughout to auscultation Heart: Regular rate and rhythm; no murmurs, rubs or bruits Abdomen: Soft, non tender and non distended. No masses, hepatosplenomegaly or hernias noted. Normal Bowel sounds Rectal: Not done Musculoskeletal: Symmetrical with no gross deformities  Pulses:  Normal pulses noted Extremities: No clubbing, cyanosis, edema or deformities noted Neurological: Alert oriented x 4, grossly nonfocal Psychological:  Alert and cooperative. Normal mood and affect   Assessment and Recommendations:  1.  Terminal ileal stricture with linear erosion.  History of partial small bowel obstruction.  Etiology of the stricture is unclear.  Crohn's ileitis was initially suspected however biopsies of the stricture and adjacent areas of the TI revealed normal small bowel normal.  She completed a prednisone taper about 6 weeks ago.  Continue soft diet avoiding seeds, nuts, popcorn, corn and any of the foods that cause symptoms.  Schedule CT AP enterography for further follow-up and stricture.  Consider repeat colonoscopy with biopsies.  Further follow-up plans based on CT results.  2.  IBS with  mild alternating pattern between normal and loose.  Avoid foods that exacerbate symptoms.  Dicyclomine 10 mg 3 times daily as needed.

## 2019-11-10 NOTE — Patient Instructions (Signed)
Your provider has requested that you go to the basement level for lab work before leaving today. Press "B" on the elevator. The lab is located at the first door on the left as you exit the elevator.  You have been scheduled for a CT enterography scan of the abdomen and pelvis at [facility], 1st floor Radiology.  You are scheduled on 11/17/19 at 2:30pm. You should arrive at 1:30pm to start drinking contrast for CT scan. Nothing to eat or drink 4 hours prior to this test.    WARNING: IF YOU ARE ALLERGIC TO IODINE/X-RAY DYE, PLEASE NOTIFY RADIOLOGY IMMEDIATELY AT [phone]! YOU WILL BE GIVEN A 13 HOUR PREMEDICATION PREP.   If you have any questions regarding your exam or if you need to reschedule, you may call [phone] between the hours of 8:00 am and 5:00 pm, Monday-Friday.  ________________________________________________________________________

## 2019-11-17 ENCOUNTER — Ambulatory Visit (HOSPITAL_COMMUNITY): Payer: Medicare Other

## 2019-11-23 ENCOUNTER — Telehealth: Payer: Self-pay | Admitting: Gastroenterology

## 2019-11-23 NOTE — Telephone Encounter (Signed)
Patient had Creatinine levels on 11/10/19 after her office visit with Dr. Fuller Plan. No need for repeat labs before CT scan.

## 2019-11-24 ENCOUNTER — Other Ambulatory Visit: Payer: Self-pay

## 2019-11-24 ENCOUNTER — Ambulatory Visit (HOSPITAL_COMMUNITY)
Admission: RE | Admit: 2019-11-24 | Discharge: 2019-11-24 | Disposition: A | Discharge status: Home / Self Care | Payer: Medicare Other | Source: Ambulatory Visit | Attending: Gastroenterology | Admitting: Gastroenterology

## 2019-11-24 ENCOUNTER — Encounter (HOSPITAL_COMMUNITY): Payer: Self-pay

## 2019-11-24 DIAGNOSIS — K56699 Other intestinal obstruction unspecified as to partial versus complete obstruction: Secondary | ICD-10-CM | POA: Diagnosis present

## 2019-11-24 MED ORDER — IOHEXOL 300 MG/ML  SOLN
100.0000 mL | Freq: Once | INTRAMUSCULAR | Status: AC | PRN
  Administered 2019-11-24: 100 mL via INTRAVENOUS

## 2019-11-24 MED ORDER — SODIUM CHLORIDE (PF) 0.9 % IJ SOLN
INTRAMUSCULAR | Status: AC
  Filled 2019-11-24: qty 50

## 2019-11-24 MED ORDER — BARIUM SULFATE 0.1 % PO SUSP
ORAL | Status: AC
  Filled 2019-11-24: qty 3

## 2019-11-25 ENCOUNTER — Telehealth: Payer: Self-pay | Admitting: Gastroenterology

## 2019-11-25 ENCOUNTER — Other Ambulatory Visit: Payer: Self-pay

## 2019-11-25 MED ORDER — BUDESONIDE 3 MG PO CPEP: 9.0000 mg | ORAL_CAPSULE | Freq: Every day | ORAL | 1 refills | Status: DC

## 2019-11-25 NOTE — Telephone Encounter (Signed)
Patient is returning your call.  

## 2019-11-25 NOTE — Telephone Encounter (Signed)
Patient called after going to the pharmacy to get her Entocort. She is afraid to take it because of the side effects, esp. The dizziness. She watches her grandson and is worried about that.She wants to know if there is something else she can take. Please advise

## 2019-11-25 NOTE — Telephone Encounter (Signed)
Side effects from Entocort are extremely rare. Please encourage her to fill and take Entocort as prescribed so her ileitis does not worsen, lead to a repeat hospitalization for partial SBO.

## 2019-11-25 NOTE — Telephone Encounter (Signed)
Called patient back with Dr. Lynne Leader recommendation for the Entocort, patient agrees to take it

## 2020-01-13 ENCOUNTER — Ambulatory Visit: Payer: Medicare Other | Admitting: Gastroenterology

## 2020-01-21 ENCOUNTER — Encounter: Payer: Self-pay | Admitting: Gastroenterology

## 2020-01-21 ENCOUNTER — Telehealth: Payer: Self-pay | Admitting: Gastroenterology

## 2020-01-21 ENCOUNTER — Ambulatory Visit (INDEPENDENT_AMBULATORY_CARE_PROVIDER_SITE_OTHER): Payer: Medicare Other | Admitting: Gastroenterology

## 2020-01-21 VITALS — BP 120/70 | HR 66 | Ht 64.0 in | Wt 159.0 lb

## 2020-01-21 DIAGNOSIS — K582 Mixed irritable bowel syndrome: Secondary | ICD-10-CM

## 2020-01-21 DIAGNOSIS — K56699 Other intestinal obstruction unspecified as to partial versus complete obstruction: Secondary | ICD-10-CM

## 2020-01-21 MED ORDER — BUDESONIDE 3 MG PO CPEP
9.0000 mg | ORAL_CAPSULE | Freq: Every day | ORAL | 5 refills | Status: AC
Start: 2020-01-21 — End: ?

## 2020-01-21 MED ORDER — DICYCLOMINE HCL 10 MG PO CAPS: 10.0000 mg | ORAL_CAPSULE | Freq: Three times a day (TID) | ORAL | 5 refills | Status: DC | PRN

## 2020-01-21 MED ORDER — DICYCLOMINE HCL 10 MG PO CAPS: 10.0000 mg | ORAL_CAPSULE | Freq: Three times a day (TID) | ORAL | 5 refills | Status: AC | PRN

## 2020-01-21 NOTE — Telephone Encounter (Signed)
Pls call pt. She is confused about medications that she was given today (Dyclimonie). She wants to know the difference between that med and entocort.

## 2020-01-21 NOTE — Patient Instructions (Addendum)
Please contact Dr. Karna Christmas office to ask about tapering off prednisone. Once you have completely stopped prednisone, resume Entcort 9 mg daily.   We have sent the following medications to your pharmacy for you to pick up at your convenience: Entocort and dicyclomine.   Thank you for choosing me and [facility].  Pricilla Riffle. Dagoberto Ligas., MD., Marval Regal

## 2020-01-21 NOTE — Telephone Encounter (Signed)
Left message for patient to return my call.

## 2020-01-21 NOTE — Progress Notes (Addendum)
    History of Present Illness: This is a 74 year old female returning for follow-up of terminal ileal stricture and history of partial small bowel obstruction.  Crohn's ileitis has been suspected however biopsies did not confirm.  She relates no difficulties with her bowels for a couple months.  Due to a misunderstanding she has not discontinued prednisone and has not started Entocort.  Prednisone is prescribed by Dr. Vickki Muff and she was under the mistaken impression that we had prescribed her current course of prednisone.  Dicyclomine has been effective in controlling of crampy abdominal pain.   CT enterography 11/24/2019 1. Stigmata of enteritis with ileal stricture and partial obstruction. Pattern of disease could be seen in the setting of Crohn's disease. 2. Skip lesion in the ileum as described. 3. Colonic diverticulosis worse in the sigmoid colon. 4. LEFT adnexal cystic lesion approximately 3.7 cm within 1 mm of prior study in terms of greatest dimension. Suggest follow-up pelvic sonogram if not yet performed. This recommendation follows ACR consensus guidelines: White Paper of the ACR Incidental Findings Committee II on Adnexal Findings. J Am Coll Radiol 2013:10:675-681. 5. Hepatic cysts. 6. RIGHT adrenal nodule is unchanged measuring approximately 1 cm by 1.1 cm. Density values compatible with adenoma on exam from 2018. 7. Extensive sigmoid diverticulosis without evidence of diverticulitis. 8. Aortic atherosclerosis  Current Medications, Allergies, Past Medical History, Past Surgical History, Family History and Social History were reviewed in [employer] record.   Physical Exam: General: Well developed, well nourished, no acute distress Head: Normocephalic and atraumatic Eyes:  sclerae anicteric, EOMI Ears: Normal auditory acuity Mouth: Not examined, mask on during Covid-19 pandemic Lungs: Clear throughout to auscultation Heart: Regular rate and rhythm; no  murmurs, rubs or bruits Abdomen: Soft, non tender and non distended. No masses, hepatosplenomegaly or hernias noted. Normal Bowel sounds Rectal: Not done Musculoskeletal: Symmetrical with no gross deformities  Pulses:  Normal pulses noted Extremities: No clubbing, cyanosis, edema or deformities noted Neurological: Alert oriented x 4, grossly nonfocal Psychological:  Alert and cooperative. Normal mood and affect   Assessment and Recommendations:  1.  Terminal ileal stricture with history of small bowel obstruction.  Crohn's ileitis is suspected but has not been biopsy-proven.  Advised her to contact Dr. Vickki Muff regarding her prednisone prescription.  If she can discontinue prednisone then begin Entocort 9 mg daily.  If she is to remain on prednisone no Entocort. REV in 6 months.  2. IBS-A.  Symptoms currently well controlled.  Continue dicyclomine 10 mg p.o. 3 times daily as needed.  3.  Left adnexal cystic lesion.  Follow-up with GYN.

## 2020-01-22 NOTE — Telephone Encounter (Signed)
Patient states she took her last pill of prednisone yesterday and wanted to verify that she is supposed to start the Entocort. Patient states she took one pill this morning. Informed patient that she is to be completely off prednisone before starting Entocort and is also supposed to take 3 capsules of Entocort once daily to equal 9 mg. Patient states she only took one pill but will go back home right now and take 2 more capsules. Informed patient to make she takes 3 capsules of Entocort daily. Patient verbalized understanding.

## 2020-02-02 NOTE — Telephone Encounter (Signed)
Left message for patient to call back  

## 2020-02-02 NOTE — Telephone Encounter (Signed)
Patient called states she has been sick with diarrhea since she started Dicyclomine

## 2020-02-04 NOTE — Telephone Encounter (Signed)
Patient is having dizziness with dicyclomine and entocort.  She will try holding the budesonide for a few days then resume PRN.  She will call back for any additional questions or concerns.

## 2020-02-05 ENCOUNTER — Telehealth: Payer: Self-pay | Admitting: Gastroenterology

## 2020-02-05 ENCOUNTER — Other Ambulatory Visit: Payer: Self-pay

## 2020-02-05 DIAGNOSIS — K529 Noninfective gastroenteritis and colitis, unspecified: Secondary | ICD-10-CM

## 2020-02-05 MED ORDER — ONDANSETRON 4 MG PO TBDP: 4.0000 mg | ORAL_TABLET | Freq: Three times a day (TID) | ORAL | 0 refills | Status: DC | PRN

## 2020-02-05 NOTE — Telephone Encounter (Signed)
Patient notified She will come in for labs on Monday before office visit with Dr. Fuller Plan at 10:50

## 2020-02-05 NOTE — Telephone Encounter (Signed)
Very unusual for Entocort to cause side effects as it has very little systemic absorption. It is released directly inside the gut lumen, not via the bloodstream.   Im concerned something else is causing her symptoms. We need to know if she remains on prednisone from another physician and what dose she is currently taking. Please confirm her med list is updated. See my last office note.   Discuss starting 6MP or a biologic t o manage her disease if she feels she needs to stop Entocort. Schedule REV soon 1-2 weeks with me or APP.   Send now: quantiferon gold HBsAg TMPT  CMP CBC lipase CRP ESR

## 2020-02-05 NOTE — Telephone Encounter (Signed)
Spoke with patient she is unable to tolerate the budesonide. She states that it has cause her to become weak, shaky, and have nausea. Please advise how to proceed? A refill on patient's ondansetron has been sent to her pharmacy.

## 2020-02-08 ENCOUNTER — Encounter: Payer: Self-pay | Admitting: Gastroenterology

## 2020-02-08 ENCOUNTER — Other Ambulatory Visit: Payer: Medicare Other

## 2020-02-08 ENCOUNTER — Ambulatory Visit (INDEPENDENT_AMBULATORY_CARE_PROVIDER_SITE_OTHER): Payer: Medicare Other | Admitting: Gastroenterology

## 2020-02-08 ENCOUNTER — Other Ambulatory Visit (INDEPENDENT_AMBULATORY_CARE_PROVIDER_SITE_OTHER): Payer: Medicare Other

## 2020-02-08 VITALS — BP 110/68 | HR 76 | Ht 63.0 in | Wt 156.0 lb

## 2020-02-08 DIAGNOSIS — R110 Nausea: Secondary | ICD-10-CM

## 2020-02-08 DIAGNOSIS — K529 Noninfective gastroenteritis and colitis, unspecified: Secondary | ICD-10-CM

## 2020-02-08 LAB — COMPREHENSIVE METABOLIC PANEL
ALT: 14 U/L (ref 0–35)
AST: 14 U/L (ref 0–37)
Albumin: 4.3 g/dL (ref 3.5–5.2)
Alkaline Phosphatase: 90 U/L (ref 39–117)
BUN: 11 mg/dL (ref 6–23)
CO2: 26 mEq/L (ref 19–32)
Calcium: 9.5 mg/dL (ref 8.4–10.5)
Chloride: 103 mEq/L (ref 96–112)
Creatinine, Ser: 0.75 mg/dL (ref 0.40–1.20)
GFR: 75.59 mL/min (ref >60.00)
Glucose, Bld: 90 mg/dL (ref 70–99)
Potassium: 4 mEq/L (ref 3.5–5.1)
Sodium: 139 mEq/L (ref 135–145)
Total Bilirubin: 0.4 mg/dL (ref 0.2–1.2)
Total Protein: 7.1 g/dL (ref 6.0–8.3)

## 2020-02-08 LAB — CBC
HCT: 37.4 % (ref 36.0–46.0)
Hemoglobin: 12.3 g/dL (ref 12.0–15.0)
MCHC: 33 g/dL (ref 30.0–36.0)
MCV: 87.4 fl (ref 78.0–100.0)
Platelets: 239 10*3/uL (ref 150.0–400.0)
RBC: 4.28 Mil/uL (ref 3.87–5.11)
RDW: 14.7 % (ref 11.5–15.5)
WBC: 6.2 10*3/uL (ref 4.0–10.5)

## 2020-02-08 LAB — SEDIMENTATION RATE: Sed Rate: 22 mm/hr (ref 0–30)

## 2020-02-08 LAB — LIPASE: Lipase: 26 U/L (ref 11.0–59.0)

## 2020-02-08 LAB — C-REACTIVE PROTEIN: CRP: <1 mg/dL (ref 0.5–20.0)

## 2020-02-08 MED ORDER — FAMOTIDINE 40 MG PO TABS
40.0000 mg | ORAL_TABLET | Freq: Every day | ORAL | 11 refills | Status: DC
Start: 2020-02-08 — End: 2020-03-14

## 2020-02-08 MED ORDER — ONDANSETRON 4 MG PO TBDP: 4.0000 mg | ORAL_TABLET | Freq: Three times a day (TID) | ORAL | 2 refills | Status: DC | PRN

## 2020-02-08 NOTE — Progress Notes (Signed)
    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  She has had problems with frequent nausea generally in the mornings.  This problem has been present for several years but is worsened in the past few weeks.  She is concerned her symptoms are due to Entocort.  She states her reflux symptoms are well controlled with omeprazole.  Other than the nausea she has no gastrointestinal complaints.   Current Medications, Allergies, Past Medical History, Past Surgical History, Family History and Social History were reviewed in [employer] record.  Physical Exam: General: Well developed, well nourished, no acute distress Head: Normocephalic and atraumatic Eyes:  sclerae anicteric, EOMI Ears: Normal auditory acuity Mouth: Not examined, mask on during Covid-19 pandemic Lungs: Clear throughout to auscultation Heart: Regular rate and rhythm; no murmurs, rubs or bruits Abdomen: Soft, non tender and non distended. No masses, hepatosplenomegaly or hernias noted. Normal Bowel sounds Rectal: Not done Musculoskeletal: Symmetrical with no gross deformities  Pulses:  Normal pulses noted Extremities: No clubbing, cyanosis, edema or deformities noted Neurological: Alert oriented x 4, grossly nonfocal Psychological:  Alert and cooperative. Normal mood and affect   Assessment and Recommendations:  1. Nausea, worse in the morning. This is an intermittent, recurrent problem for several years that has worsened over the past few weeks.  She feels that Entocort and Metformin are the likely causes of nausea.  Her history of nausea predates her use of Entocort.  Entocort is unlikely to be the cause as it is minimally absorbed systemically.  I am concerned other medications may be contributing to her nausea or perhaps her symptoms are related to nocturnal reflux.  We will continue omeprazole 40 mg  p.o. every morning and begin famotidine 40 mg p.o. at bedtime for 1 month and assess response.  Continue Zofran 4 mg p.o. 3 times daily as needed nausea.  I have asked her to review her other medications with her primary care physician.  We discussed the other options of treatment for suspected Crohn's including 6-MP and biologic such as Humira. She would like to remain on Entocort for now.  She requested to change her Entocort dosing to 3 mg 3 times daily and assess the response. REV in 2 months.   2. Terminal ileal stricture. Suspected Crohn's ileitis. See above. REV in 2 months.

## 2020-02-08 NOTE — Patient Instructions (Addendum)
If you are age 74 or older, your body mass index should be between 23-30. Your Body mass index is 27.63 kg/m. If this is out of the aforementioned range listed, please consider follow up with your Primary Care Provider.  If you are age 73 or younger, your body mass index should be between 19-25. Your Body mass index is 27.63 kg/m. If this is out of the aformentioned range listed, please consider follow up with your Primary Care Provider.   We have sent the following medications to your pharmacy for you to pick up at your convenience: Zofran 4 mg three daily as needed, Famotidine 40 mg once daily at bedtime .   Marland KitchenThank you for choosing me and [facility].  Pricilla Riffle. Dagoberto Ligas., MD., [facility]

## 2020-02-13 LAB — QUANTIFERON-TB GOLD PLUS
Mitogen-NIL: 7.47 IU/mL
NIL: 0.02 IU/mL
QuantiFERON-TB Gold Plus: NEGATIVE
TB1-NIL: <0 IU/mL
TB2-NIL: <0 IU/mL

## 2020-02-13 LAB — THIOPURINE METHYLTRANSFERASE (TPMT), RBC: Thiopurine Methyltransferase, RBC: 13 nmol/h/mL

## 2020-02-13 LAB — HEPATITIS B SURFACE ANTIGEN: Hepatitis B Surface Ag: NONREACTIVE

## 2020-03-14 ENCOUNTER — Other Ambulatory Visit: Payer: Self-pay

## 2020-03-14 MED ORDER — FAMOTIDINE 40 MG PO TABS: 40.0000 mg | ORAL_TABLET | Freq: Every day | ORAL | 11 refills | Status: AC

## 2020-03-18 ENCOUNTER — Telehealth: Payer: Self-pay | Admitting: Gastroenterology

## 2020-03-18 MED ORDER — ONDANSETRON 4 MG PO TBDP: 4.0000 mg | ORAL_TABLET | Freq: Three times a day (TID) | ORAL | 2 refills | Status: AC | PRN

## 2020-03-18 NOTE — Telephone Encounter (Signed)
Prescription sent to patient's pharmacy.

## 2020-03-29 ENCOUNTER — Encounter: Payer: Self-pay | Admitting: Gastroenterology

## 2020-03-29 ENCOUNTER — Ambulatory Visit (INDEPENDENT_AMBULATORY_CARE_PROVIDER_SITE_OTHER): Payer: Medicare Other | Admitting: Gastroenterology

## 2020-03-29 VITALS — BP 118/68 | HR 60 | Ht 63.0 in | Wt 159.6 lb

## 2020-03-29 DIAGNOSIS — K50018 Crohn's disease of small intestine with other complication: Secondary | ICD-10-CM

## 2020-03-29 DIAGNOSIS — K56699 Other intestinal obstruction unspecified as to partial versus complete obstruction: Secondary | ICD-10-CM | POA: Diagnosis not present

## 2020-03-29 MED ORDER — OMEPRAZOLE 40 MG PO CPDR: 40.0000 mg | DELAYED_RELEASE_CAPSULE | Freq: Every day | ORAL | 11 refills | Status: AC

## 2020-03-29 NOTE — Patient Instructions (Signed)
We have sent the following medications to your pharmacy for you to pick up at your convenience: omeprazole.   Thank you for choosing me and [facility].  Pricilla Riffle. Dagoberto Ligas., MD., [facility]

## 2020-03-29 NOTE — Progress Notes (Signed)
    History of Present Illness: This is a 74 year old female with suspected Crohn's ileitis with ileal stricture.  She states she has been feeling very well for the past several weeks.  Her morning nausea is under better control taking famotidine at bedtime.  Current Medications, Allergies, Past Medical History, Past Surgical History, Family History and Social History were reviewed in [employer] record.   Physical Exam: General: Well developed, well nourished, no acute distress Head: Normocephalic and atraumatic Eyes:  sclerae anicteric, EOMI Ears: Normal auditory acuity Mouth: Not examined, mask on during Covid-19 pandemic Lungs: Clear throughout to auscultation Heart: Regular rate and rhythm; no murmurs, rubs or bruits Abdomen: Soft, non tender and non distended. No masses, hepatosplenomegaly or hernias noted. Normal Bowel sounds Rectal: Not done Musculoskeletal: Symmetrical with no gross deformities  Pulses:  Normal pulses noted Extremities: No clubbing, cyanosis, edema or deformities noted Neurological: Alert oriented x 4, grossly nonfocal Psychological:  Alert and cooperative. Normal mood and affect   Assessment and Recommendations:  1. 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  Continue Entocort 3 mg p.o. 3 times daily, which is her preferred dosing schedule.  REV 6 months.   2.  GERD, morning nausea.  Closely follow antireflux measures.  Continue omeprazole 40 mg p.o. every morning and famotidine 40 mg p.o. at bedtime.

## 2020-05-08 ENCOUNTER — Other Ambulatory Visit: Payer: Self-pay

## 2020-05-08 ENCOUNTER — Encounter (HOSPITAL_COMMUNITY): Payer: Self-pay

## 2020-05-08 DIAGNOSIS — K5900 Constipation, unspecified: Secondary | ICD-10-CM | POA: Insufficient documentation

## 2020-05-08 DIAGNOSIS — Z5321 Procedure and treatment not carried out due to patient leaving prior to being seen by health care provider: Secondary | ICD-10-CM | POA: Diagnosis not present

## 2020-05-08 DIAGNOSIS — R109 Unspecified abdominal pain: Secondary | ICD-10-CM | POA: Diagnosis present

## 2020-05-08 DIAGNOSIS — E119 Type 2 diabetes mellitus without complications: Secondary | ICD-10-CM | POA: Diagnosis not present

## 2020-05-08 DIAGNOSIS — R110 Nausea: Secondary | ICD-10-CM | POA: Insufficient documentation

## 2020-05-08 LAB — CBC
HCT: 39.6 % (ref 36.0–46.0)
Hemoglobin: 12.9 g/dL (ref 12.0–15.0)
MCH: 29.2 pg (ref 26.0–34.0)
MCHC: 32.6 g/dL (ref 30.0–36.0)
MCV: 89.6 fL (ref 80.0–100.0)
Platelets: 268 10*3/uL (ref 150–400)
RBC: 4.42 MIL/uL (ref 3.87–5.11)
RDW: 12.4 % (ref 11.5–15.5)
WBC: 11 10*3/uL — ABNORMAL HIGH (ref 4.0–10.5)
nRBC: 0 % (ref 0.0–0.2)

## 2020-05-08 LAB — CBG MONITORING, ED: Glucose-Capillary: 140 mg/dL — ABNORMAL HIGH (ref 70–99)

## 2020-05-08 NOTE — ED Triage Notes (Signed)
Patient arrived stating today at noon she began having abdominal pain and nausea reports being constipated over the last few days. Took a zofran 3 hours ago. Patient also states she is a diabetic and have not checked her blood sugar in 2 weeks, reports feeling "really thirsty"

## 2020-05-08 NOTE — ED Notes (Signed)
Pt stated to screener that her pain was fading away so she decided to go home.

## 2020-05-09 ENCOUNTER — Emergency Department (HOSPITAL_COMMUNITY)
Admission: EM | Admit: 2020-05-09 | Discharge: 2020-05-09 | Disposition: A | Discharge status: Home / Self Care | Payer: Medicare Other | Attending: Emergency Medicine | Admitting: Emergency Medicine

## 2020-05-09 LAB — COMPREHENSIVE METABOLIC PANEL
ALT: 17 U/L (ref 0–44)
AST: 17 U/L (ref 15–41)
Albumin: 4.2 g/dL (ref 3.5–5.0)
Alkaline Phosphatase: 95 U/L (ref 38–126)
Anion gap: 12 (ref 5–15)
BUN: 12 mg/dL (ref 8–23)
CO2: 22 mmol/L (ref 22–32)
Calcium: 9.4 mg/dL (ref 8.9–10.3)
Chloride: 102 mmol/L (ref 98–111)
Creatinine, Ser: 0.66 mg/dL (ref 0.44–1.00)
GFR, Estimated: >60 mL/min (ref >60)
Glucose, Bld: 150 mg/dL — ABNORMAL HIGH (ref 70–99)
Potassium: 3.6 mmol/L (ref 3.5–5.1)
Sodium: 136 mmol/L (ref 135–145)
Total Bilirubin: 0.4 mg/dL (ref 0.3–1.2)
Total Protein: 7.6 g/dL (ref 6.5–8.1)

## 2020-05-09 LAB — URINALYSIS, ROUTINE W REFLEX MICROSCOPIC
Bilirubin Urine: NEGATIVE
Glucose, UA: NEGATIVE mg/dL
Hgb urine dipstick: NEGATIVE
Ketones, ur: NEGATIVE mg/dL
Nitrite: NEGATIVE
Protein, ur: NEGATIVE mg/dL
Specific Gravity, Urine: 1.025 (ref 1.005–1.030)
pH: 5 (ref 5.0–8.0)

## 2020-05-09 LAB — LIPASE, BLOOD: Lipase: 25 U/L (ref 11–51)

## 2020-05-10 ENCOUNTER — Telehealth: Payer: Self-pay | Admitting: Gastroenterology

## 2020-05-10 NOTE — Telephone Encounter (Signed)
Left message for patient to call back  

## 2020-05-10 NOTE — Telephone Encounter (Signed)
Pt is requesting a call back from a nurse to discuss her stomach pain, pt states she has not been able to eat for two days. Pt is also experiencing nausea and diarrhea, pt would like some advice on what to do.

## 2020-05-16 NOTE — Telephone Encounter (Signed)
No return calls from the patient.  I will await her return call

## 2020-07-27 ENCOUNTER — Other Ambulatory Visit: Payer: Self-pay | Admitting: Internal Medicine

## 2020-07-27 DIAGNOSIS — K56600 Partial intestinal obstruction, unspecified as to cause: Secondary | ICD-10-CM

## 2021-12-09 IMAGING — CT CT ABD-PELV W/ CM
2 of 5 series · 15 of 46 positions shown, 17 images · IV contrast (Omnipaque)
Comparison: CT of the abdomen and pelvis without contrast
10/05/2016. CT abdomen pelvis without and with contrast 08/24/2015

CLINICAL DATA: Abdominal pain. Nausea and vomiting.

EXAM:
CT ABDOMEN AND PELVIS WITH CONTRAST
TECHNIQUE: Multidetector CT imaging of the abdomen and pelvis was performed
using the standard protocol following bolus administration of
intravenous contrast.
CONTRAST:  100mL OMNIPAQUE IOHEXOL 300 MG/ML  SOLN

[Series 2: axial st · axial · 0.73mm/px · z∈[-450,-60]mm · 12 of 88 slices shown, 14 images]
[im 5/88  soft-tissue]
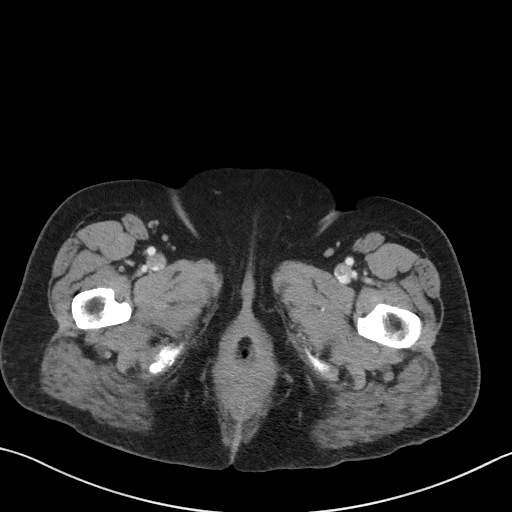
[im 5/88  bone]
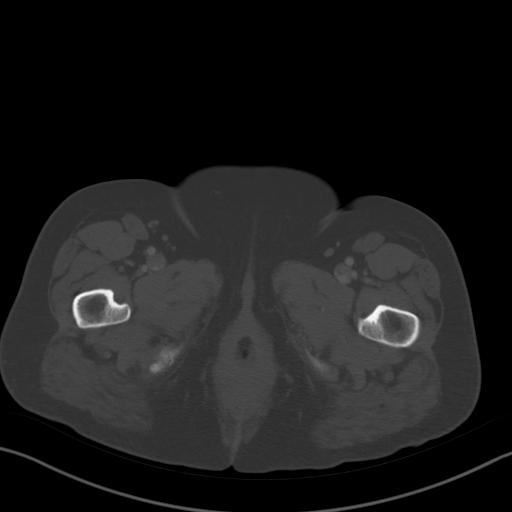
[im 14/88  soft-tissue]
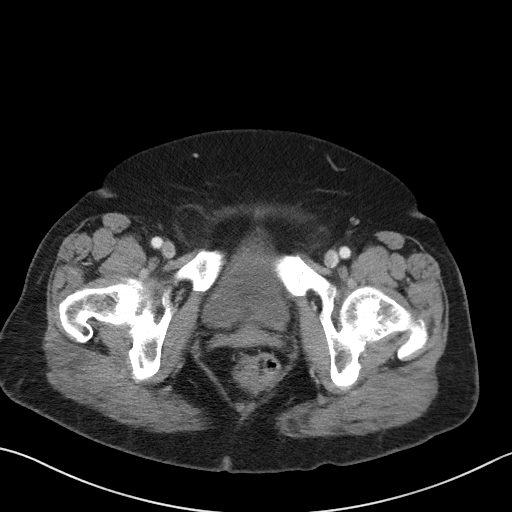
[im 19/88  soft-tissue]
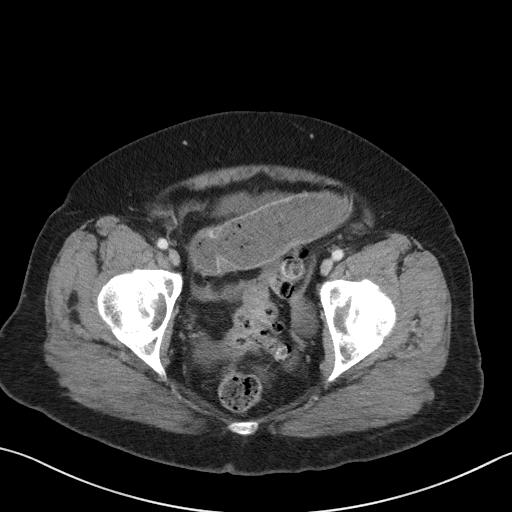
[im 28/88  soft-tissue]
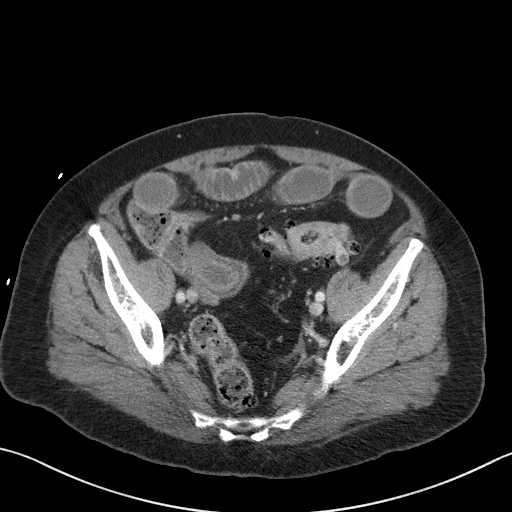
[im 33/88  soft-tissue]
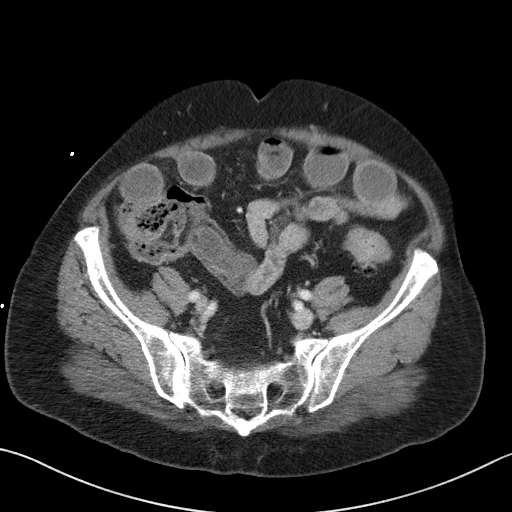
[im 42/88  soft-tissue]
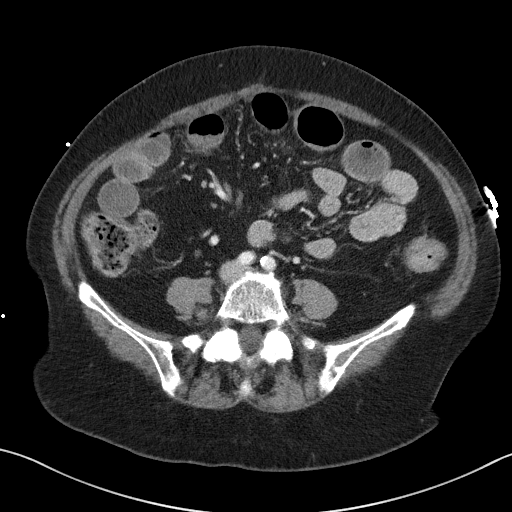
[im 46/88  soft-tissue]
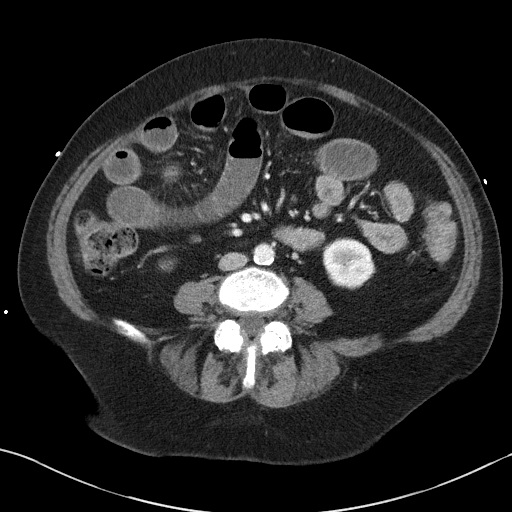
[im 55/88  soft-tissue]
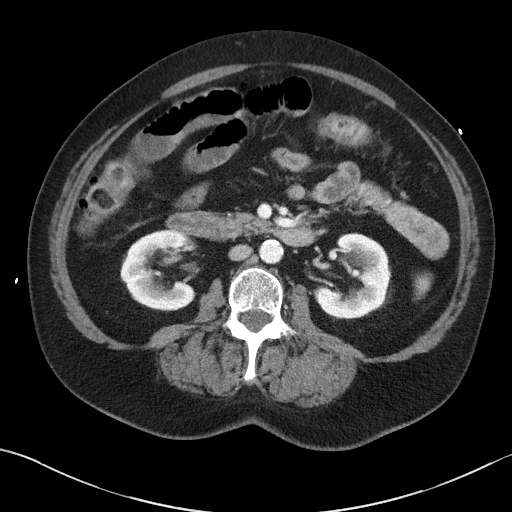
[im 60/88  soft-tissue]
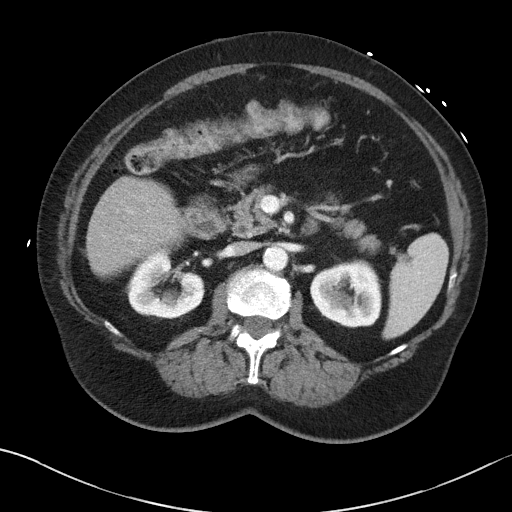
[im 60/88  bone]
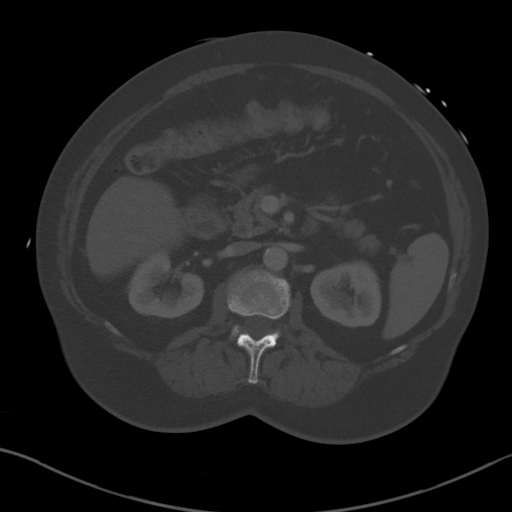
[im 69/88  soft-tissue]
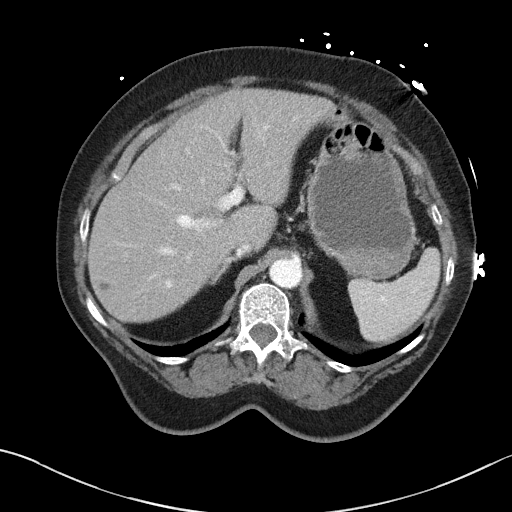
[im 74/88  soft-tissue]
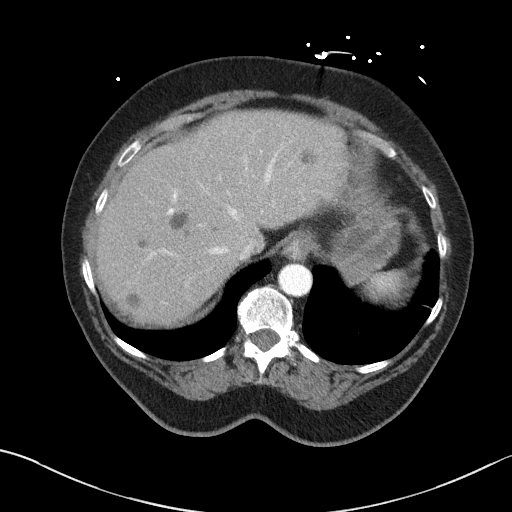
[im 83/88  soft-tissue]
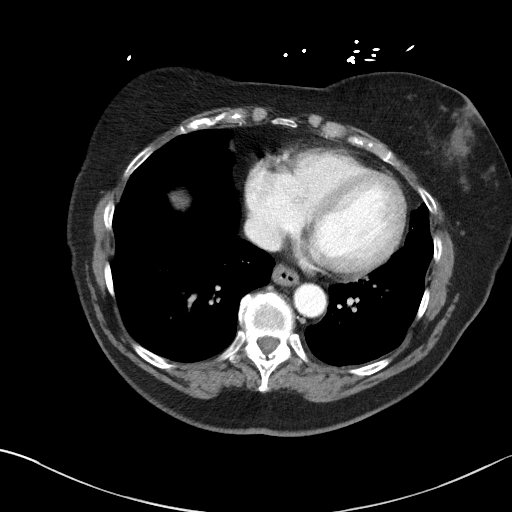

[Series 5: coronal st · coronal · 0.70mm/px · 3 of 104 slices shown]
[im 35/104  soft-tissue]
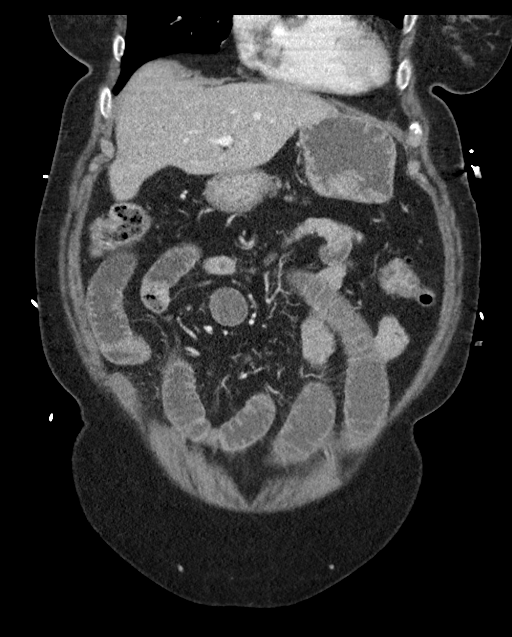
[im 46/104  soft-tissue]
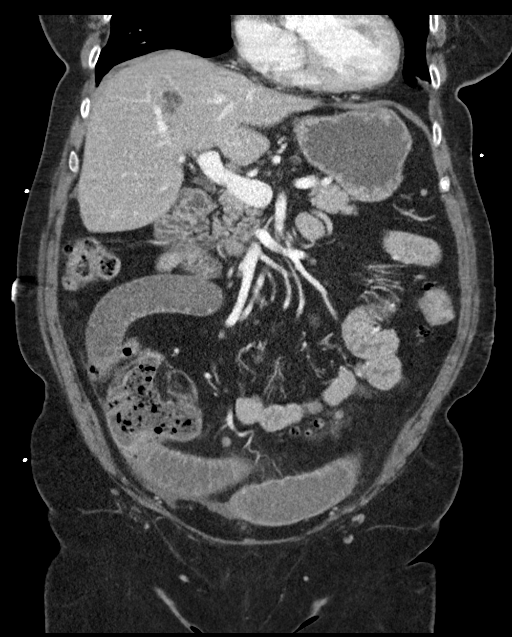
[im 58/104  soft-tissue]
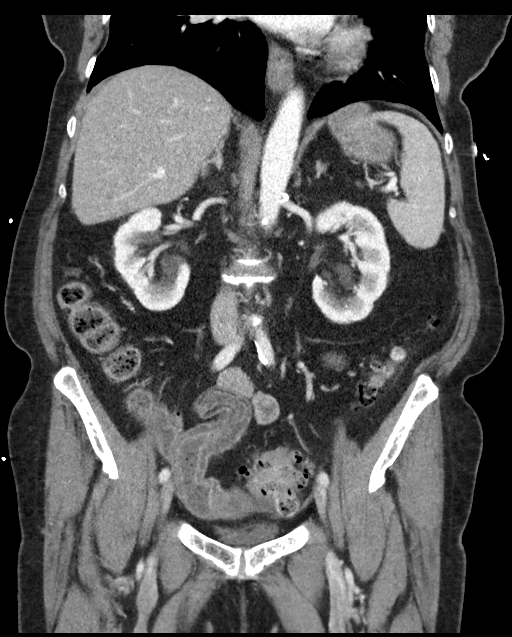

[15 of 46 positions shown; findings below may reference images not displayed]

FINDINGS: Lower chest: Linear atelectasis or scarring is present in the left
lower lobe. The heart size is normal. No significant pleural or
pericardial effusion is present.

Hepatobiliary: Multiple bilateral static cysts are again seen.
Cholecystectomy is noted. The common bile duct is within normal
limits.

Pancreas: Unremarkable. No pancreatic ductal dilatation or
surrounding inflammatory changes.

Spleen: Normal in size without focal abnormality.

Adrenals/Urinary Tract: A right adrenal adenoma is stable. The left
adrenal gland is normal. Kidneys and ureters are within limits. The
urinary bladder is mostly collapsed.

Stomach/Bowel: Stomach and duodenum are within limits. Distal loops
of small bowel progressively dilated to 3.7 cm. There is marked wall
thickening in the terminal ileum. No mass lesion is present. The
appendix is within normal limits. The ascending colon is within
normal limits. Diverticula are present throughout the colon
beginning in the transverse colon and most prominent in the sigmoid
colon. No focal inflammatory changes present about the colon.

Vascular/Lymphatic: Atherosclerotic calcifications are present the
aorta without aneurysm. No significant adenopathy is present.

Reproductive: A progressively enlarging left adnexal cystic lesion
now measures 4.2 x 3.6 x 2.6 cm. Uterus and right adnexa are within
normal limits.

Other: Free fluid is present within the anatomic pelvis. No free air
is present. No significant ventral hernia is present.

Musculoskeletal: Degenerative facet changes are present in the lower
lumbar spine. No focal lytic or blastic lesions are present. Bony
pelvis is within normal limits. The hips are located and within
normal limits.
IMPRESSION: 1. Distal small bowel obstruction secondary inflammatory changes in
the terminal ileum raising concern for inflammatory bowel disease.
Focal infection is also considered.
2. Free fluid within the anatomic pelvis is likely reactive.
3. Progressive enlargement of left adnexal cystic lesion now
measuring 4.2 x 3.6 x 2.6 cm. Given the interval growth of this
lesion and the patient's age, follow-up gyn referral and ultrasound
is recommended.
4. Stable hepatic cysts.
5. Stable right adrenal adenoma.

## 2021-12-10 IMAGING — DX DG ABD PORTABLE 1V
2 series · 2 of 2 positions shown · non-contrast
Comparison: Plain film of the abdomen dated 06/19/2019.

CLINICAL DATA: Abdominal pain

EXAM:
PORTABLE ABDOMEN - 1 VIEW

[abdomen kub (1 of 2)]
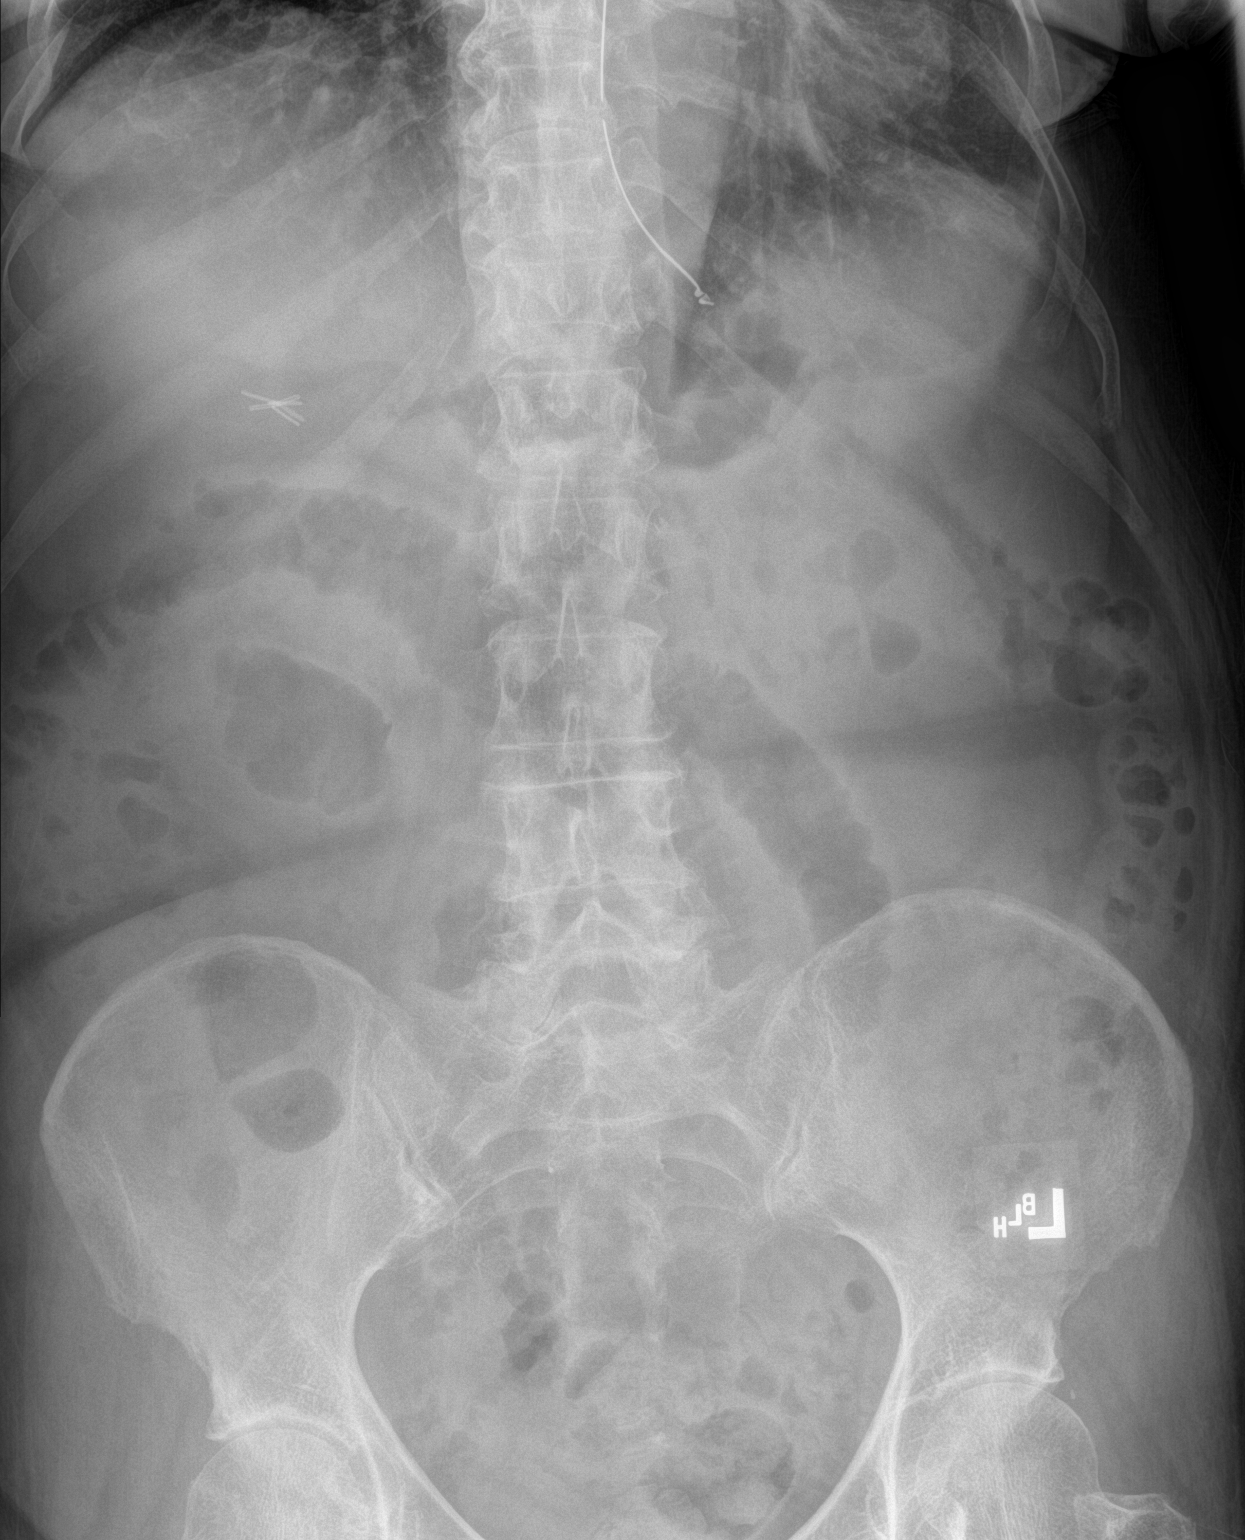

[abdomen kub (2 of 2)]
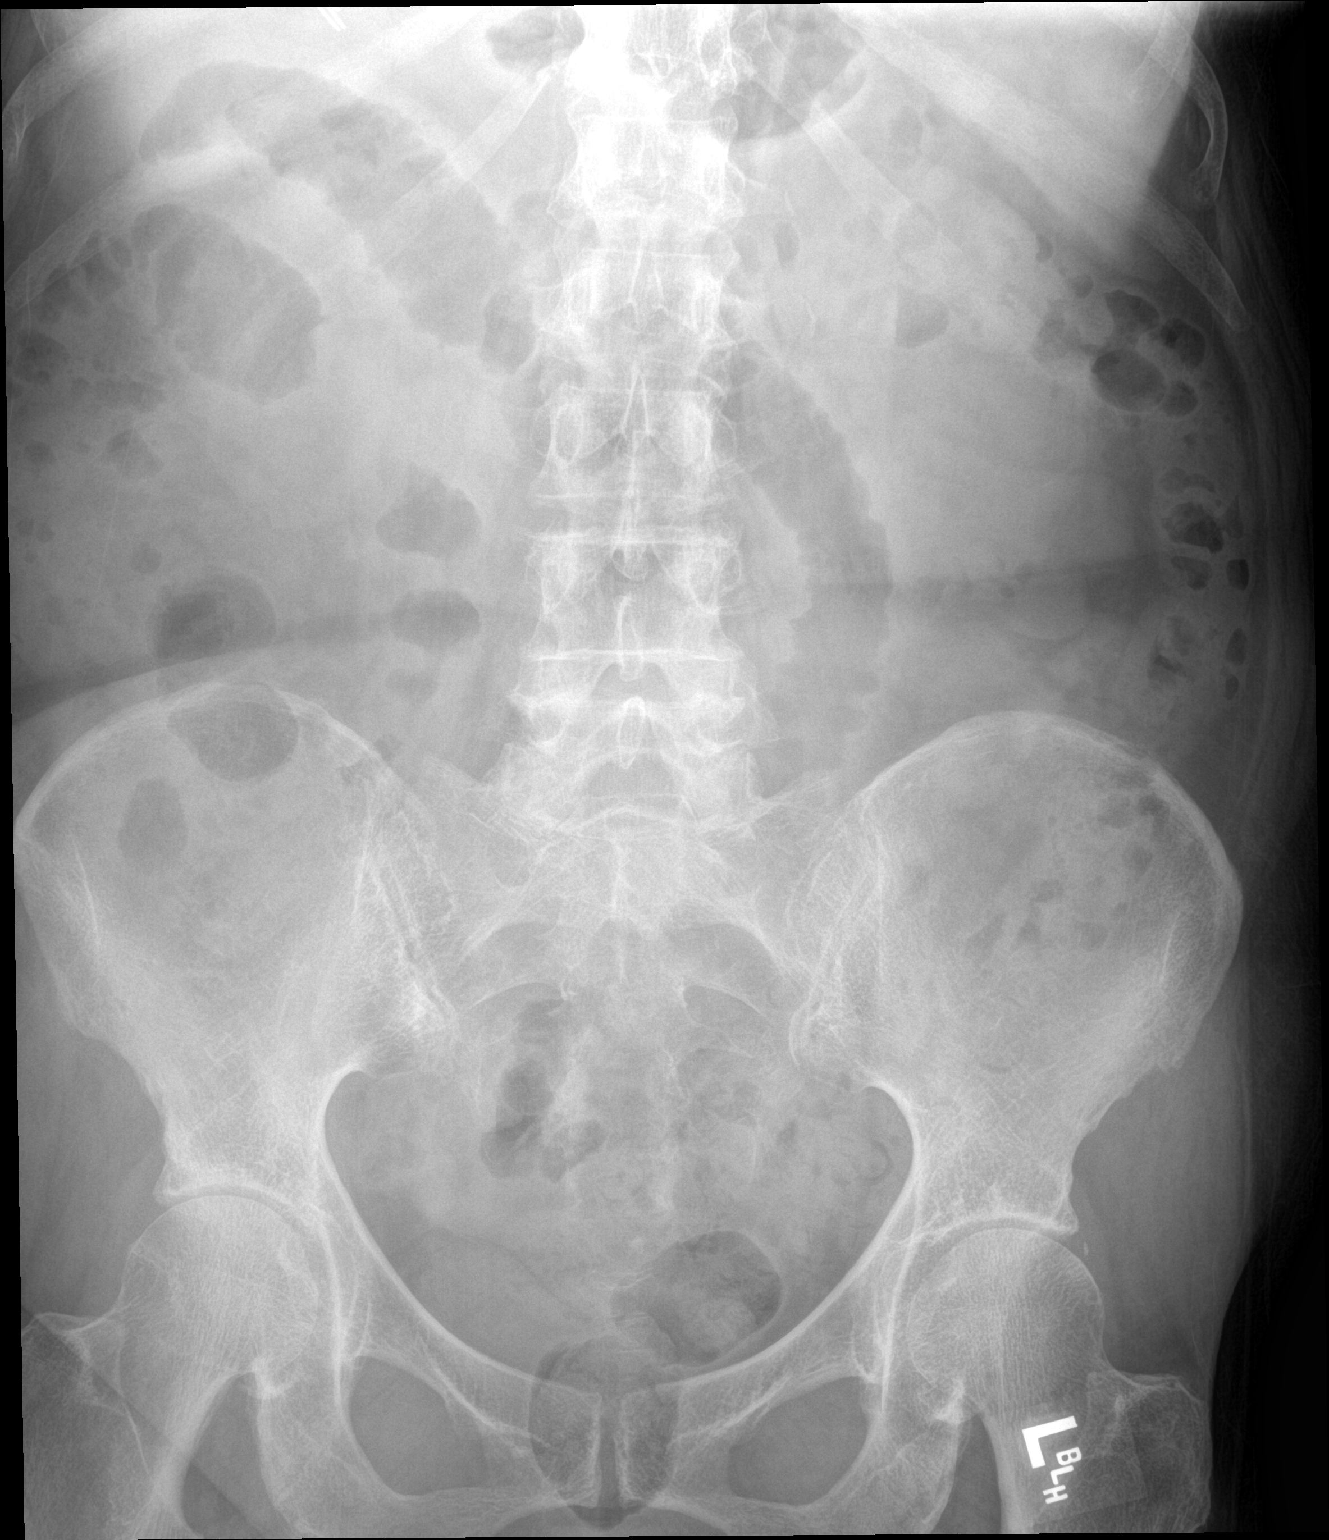

[2 of 2 positions shown; findings below may reference images not displayed]

FINDINGS: Overall bowel gas pattern is nonobstructive. Patulous loop of small
bowel within the central abdomen to RIGHT upper quadrant, suspicious
for small bowel wall thickening.

No evidence of abnormal fluid collection or free intraperitoneal
air. No evidence of renal or ureteral calculi. Cholecystectomy clips
in RIGHT upper quadrant. Enteric tube projects to the level of the
gastroesophageal junction.
IMPRESSION: 1. Patulous loop of small bowel within the central abdomen extending
to the RIGHT upper quadrant, suspicious for small bowel wall
thickening/enteritis.
2. Enteric tube tip at the level of the gastroesophageal junction.
Recommend advancing into the stomach.
3. No evidence of bowel obstruction.

## 2021-12-11 IMAGING — DX DG ABD PORTABLE 1V
1 series · 1 of 1 positions shown · non-contrast
Comparison: Single-view of the abdomen 06/20/2019 06/19/2019.

CLINICAL DATA: History of small-bowel obstruction.

EXAM:
PORTABLE ABDOMEN - 1 VIEW

[abdomen kub]
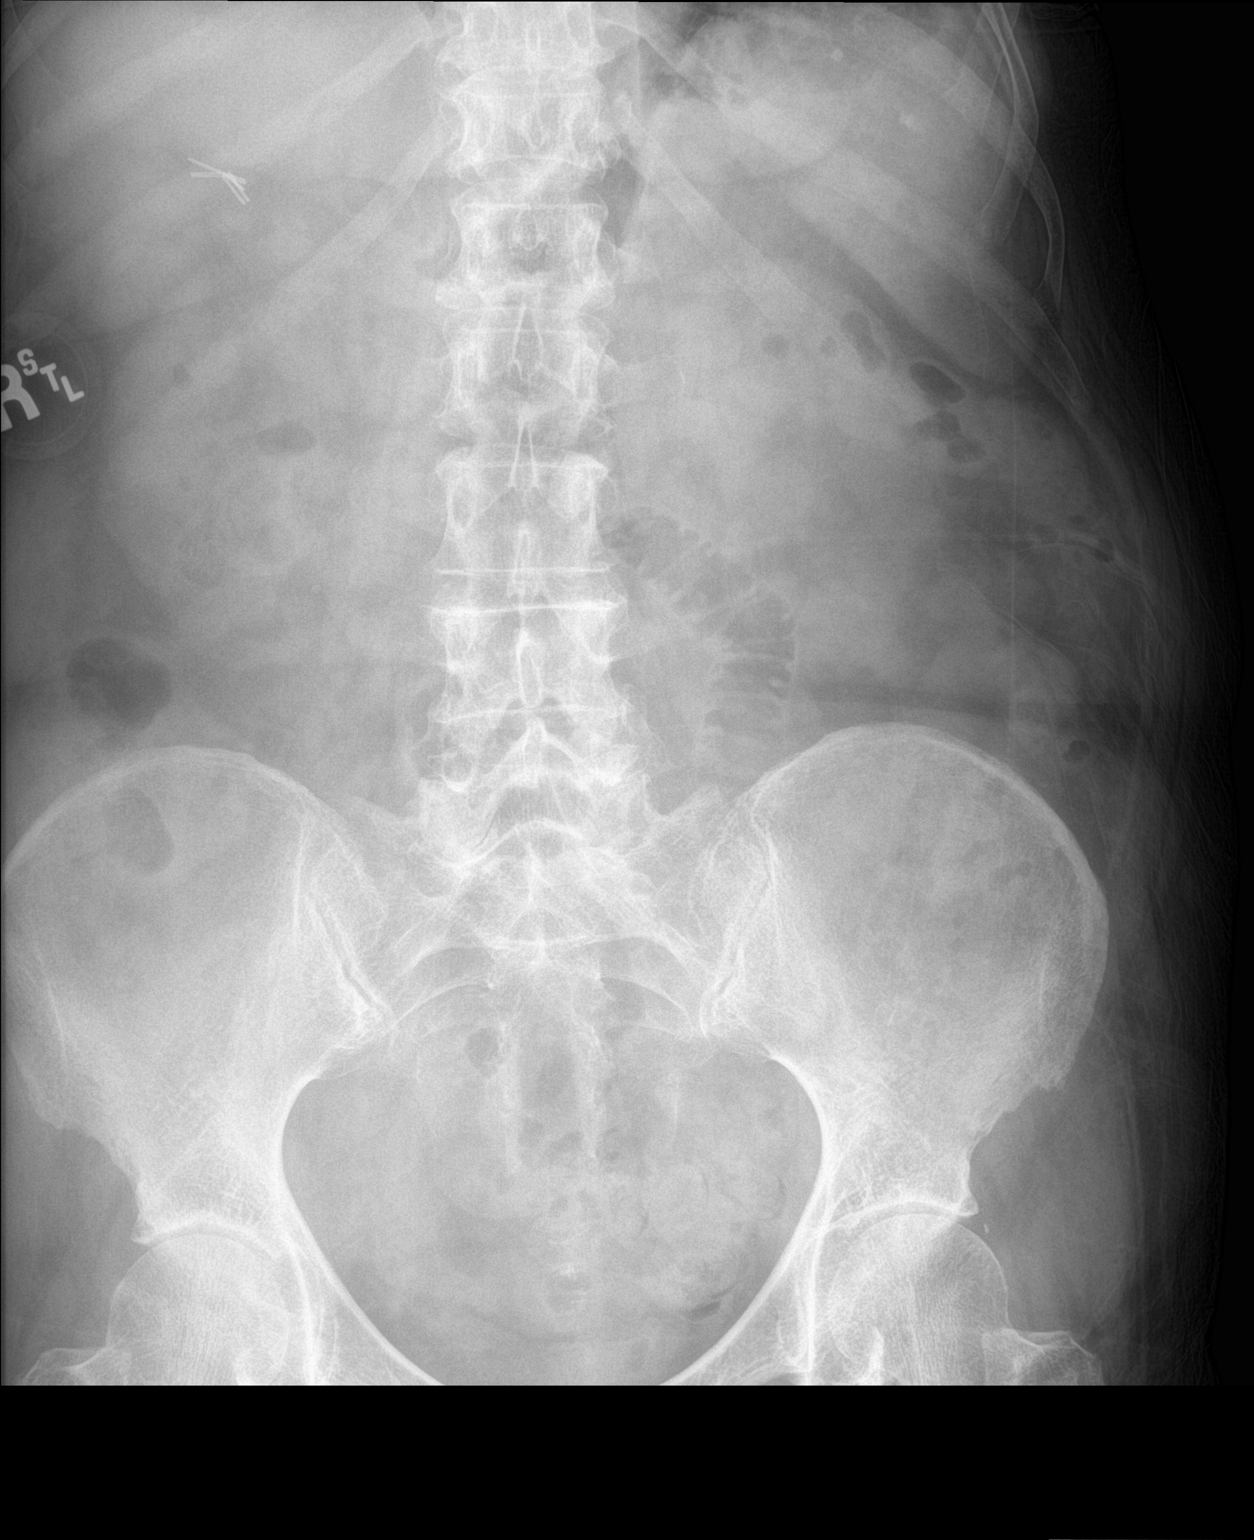

[1 of 1 positions shown; findings below may reference images not displayed]

FINDINGS: The bowel gas pattern is normal. Single gas-filled but nondilated
loop of small bowel noted. No radio-opaque calculi or other
significant radiographic abnormality are seen.
IMPRESSION: Negative exam.
